# Patient Record
Sex: Female | Born: 1980 | Race: White | Hispanic: Yes | Smoking: Never smoker
Health system: Southern US, Community
[De-identification: ages and names within clinical notes are randomized; demographics above are authoritative.]

## PROBLEM LIST (undated history)

## (undated) DIAGNOSIS — K219 Gastro-esophageal reflux disease without esophagitis: Secondary | ICD-10-CM

## (undated) HISTORY — DX: Gastro-esophageal reflux disease without esophagitis: K21.9

## (undated) HISTORY — PX: TUBAL LIGATION: SHX77

---

## 2002-03-09 ENCOUNTER — Ambulatory Visit (HOSPITAL_COMMUNITY): Admission: AD | Admit: 2002-03-09 | Discharge: 2002-03-09 | Payer: Self-pay | Admitting: *Deleted

## 2002-03-09 ENCOUNTER — Encounter (INDEPENDENT_AMBULATORY_CARE_PROVIDER_SITE_OTHER): Payer: Self-pay | Admitting: Specialist

## 2002-04-13 ENCOUNTER — Encounter: Admission: RE | Admit: 2002-04-13 | Discharge: 2002-04-13 | Payer: Self-pay | Admitting: *Deleted

## 2003-03-21 ENCOUNTER — Inpatient Hospital Stay (HOSPITAL_COMMUNITY): Admission: AD | Admit: 2003-03-21 | Discharge: 2003-03-21 | Payer: Self-pay | Admitting: Obstetrics & Gynecology

## 2003-04-07 ENCOUNTER — Encounter: Admission: RE | Admit: 2003-04-07 | Discharge: 2003-04-07 | Payer: Self-pay | Admitting: *Deleted

## 2003-04-07 ENCOUNTER — Ambulatory Visit (HOSPITAL_COMMUNITY): Admission: RE | Admit: 2003-04-07 | Discharge: 2003-04-07 | Payer: Self-pay | Admitting: *Deleted

## 2003-04-21 ENCOUNTER — Encounter: Admission: RE | Admit: 2003-04-21 | Discharge: 2003-04-21 | Payer: Self-pay | Admitting: Family Medicine

## 2003-05-05 ENCOUNTER — Encounter: Admission: RE | Admit: 2003-05-05 | Discharge: 2003-05-05 | Payer: Self-pay | Admitting: *Deleted

## 2003-05-05 ENCOUNTER — Ambulatory Visit (HOSPITAL_COMMUNITY): Admission: RE | Admit: 2003-05-05 | Discharge: 2003-05-05 | Payer: Self-pay | Admitting: Family Medicine

## 2003-05-12 ENCOUNTER — Encounter: Admission: RE | Admit: 2003-05-12 | Discharge: 2003-05-12 | Payer: Self-pay | Admitting: *Deleted

## 2003-05-19 ENCOUNTER — Encounter: Admission: RE | Admit: 2003-05-19 | Discharge: 2003-05-19 | Payer: Self-pay | Admitting: *Deleted

## 2003-05-26 ENCOUNTER — Encounter: Payer: Self-pay | Admitting: Family Medicine

## 2003-05-26 ENCOUNTER — Encounter (INDEPENDENT_AMBULATORY_CARE_PROVIDER_SITE_OTHER): Payer: Self-pay | Admitting: *Deleted

## 2003-05-26 ENCOUNTER — Inpatient Hospital Stay (HOSPITAL_COMMUNITY): Admission: RE | Admit: 2003-05-26 | Discharge: 2003-05-29 | Payer: Self-pay | Admitting: Obstetrics and Gynecology

## 2003-05-26 ENCOUNTER — Encounter: Admission: RE | Admit: 2003-05-26 | Discharge: 2003-05-26 | Payer: Self-pay | Admitting: *Deleted

## 2003-05-30 ENCOUNTER — Encounter: Admission: RE | Admit: 2003-05-30 | Discharge: 2003-06-29 | Payer: Self-pay | Admitting: Obstetrics and Gynecology

## 2007-10-20 ENCOUNTER — Encounter (INDEPENDENT_AMBULATORY_CARE_PROVIDER_SITE_OTHER): Payer: Self-pay | Admitting: Obstetrics

## 2007-10-20 ENCOUNTER — Inpatient Hospital Stay (HOSPITAL_COMMUNITY): Admission: RE | Admit: 2007-10-20 | Discharge: 2007-10-23 | Payer: Self-pay | Admitting: Obstetrics

## 2008-12-10 LAB — CONVERTED CEMR LAB

## 2009-07-18 ENCOUNTER — Ambulatory Visit: Payer: Self-pay | Admitting: Family Medicine

## 2009-07-18 DIAGNOSIS — R21 Rash and other nonspecific skin eruption: Secondary | ICD-10-CM

## 2009-07-18 DIAGNOSIS — M722 Plantar fascial fibromatosis: Secondary | ICD-10-CM

## 2009-08-01 ENCOUNTER — Ambulatory Visit: Payer: Self-pay | Admitting: Family Medicine

## 2009-08-01 LAB — CONVERTED CEMR LAB
BUN: 10 mg/dL (ref 6–23)
CO2: 21 meq/L (ref 19–32)
Calcium: 9 mg/dL (ref 8.4–10.5)
Chloride: 106 meq/L (ref 96–112)
Cholesterol: 111 mg/dL (ref 0–200)
Creatinine, Ser: 0.54 mg/dL (ref 0.40–1.20)
Glucose, Bld: 71 mg/dL (ref 70–99)
HDL: 34 mg/dL — ABNORMAL LOW (ref >39)
LDL Cholesterol: 59 mg/dL (ref 0–99)
Potassium: 4 meq/L (ref 3.5–5.3)
Sodium: 140 meq/L (ref 135–145)
Total CHOL/HDL Ratio: 3.3
Triglycerides: 90 mg/dL (ref <150)
VLDL: 18 mg/dL (ref 0–40)
Vit D, 25-Hydroxy: 43 ng/mL (ref 30–89)

## 2009-08-02 ENCOUNTER — Encounter (INDEPENDENT_AMBULATORY_CARE_PROVIDER_SITE_OTHER): Payer: Self-pay | Admitting: Family Medicine

## 2009-08-31 ENCOUNTER — Ambulatory Visit: Payer: Self-pay | Admitting: Nurse Practitioner

## 2009-08-31 DIAGNOSIS — B353 Tinea pedis: Secondary | ICD-10-CM

## 2010-06-28 ENCOUNTER — Ambulatory Visit: Payer: Self-pay | Admitting: Nurse Practitioner

## 2010-09-11 NOTE — Assessment & Plan Note (Signed)
Summary: Acute - Tinea Pedis   Vital Signs:  Patient profile:   30 year old female Menstrual status:  regular LMP:     08/20/2009 Weight:      217.1 pounds Temp:     97.4 degrees F oral Pulse rate:   61 / minute Pulse rhythm:   regular Resp:     16 per minute BP sitting:   108 / 74  (left arm) Cuff size:   regular  Vitals Entered By: Levon Hedger (August 31, 2009 12:23 PM) CC: right foot infection or fungus x several months very itchy and is spreading Is Patient Diabetic? No Pain Assessment Patient in pain? yes     Location: foot Intensity: 5  Does patient need assistance? Functional Status Self care Ambulation Normal LMP (date): 08/20/2009     Enter LMP: 08/20/2009 Last PAP Result done at [facility]   Primary Care Provider:  Carolyne Fiscal  CC:  right foot infection or fungus x several months very itchy and is spreading.  History of Present Illness:  Pt into the office with foot problems. Right foot is very painful and puritic. Started 1 month ago. Previously affected about 9 months ago and she was treated with the ointment. 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 Also sweats alot and feet stay moist   Medications Prior to Update: 1)  None  Allergies (verified): No Known Drug Allergies  Review of Systems CV:  Denies chest pain or discomfort. Resp:  Denies cough. Derm:  Complains of lesion(s).  Physical Exam  General:  alert.   Neurologic:  alert & oriented X3 and gait normal.   Skin:  right foot - 3rd, 4th and 5th toe with peeling skin, excoriation - tinea pedis Psych:  Oriented X3.     Impression & Recommendations:  Problem # 1:  TINEA PEDIS (ICD-110.4)  advised pt of the dx and treatment  Her updated medication list for this problem includes:    Fluconazole 150 Mg Tabs (Fluconazole) ..... One tablet by mouth x 1 dose    Lotrisone 1-0.05 % Crea  (Clotrimazole-betamethasone) ..... One application topically two times a day to affected area  Complete Medication List: 1)  Fluconazole 150 Mg Tabs (Fluconazole) .... One tablet by mouth x 1 dose 2)  Lotrisone 1-0.05 % Crea (Clotrimazole-betamethasone) .... One application topically two times a day to affected area  Patient Instructions: 1)  Take difllucan 150mg  by mouth x 1 dose 2)  Use ointment two times a day to affected area.  Be sure to keep feet clean and dry 3)  follow up as needed Prescriptions: FLUCONAZOLE 150 MG TABS (FLUCONAZOLE) One tablet by mouth x 1 dose  #1 x 0   Entered and Authorized by:   Lehman Prom FNP   Signed by:   Lehman Prom FNP on 08/31/2009   Method used:   Faxed to ...       [facility] (retail)       [address] [place], [place]  [address]       Ph: [phone]       Fax: [phone]   RxID:   [id number] LOTRISONE 1-0.05 % CREA (CLOTRIMAZOLE-BETAMETHASONE) One application topically two times a day to affected area  #60gm x 1   Entered and Authorized by:   Lehman Prom FNP   Signed by:  Lehman Prom FNP on 08/31/2009   Method used:   Faxed to ...       [facility] (retail)       [address] [place], [place]  [address]       Ph: [phone]       Fax: [phone]   RxID:   [id number] FLUCONAZOLE 150 MG TABS (FLUCONAZOLE) One tablet by mouth x 1 dose  #1 x 0   Entered and Authorized by:   Lehman Prom FNP   Signed by:   Lehman Prom FNP on 08/31/2009   Method used:   Print then Give to Patient   RxID:   [id number]

## 2010-09-11 NOTE — Letter (Signed)
Summary: Handout Printed  Printed Handout:  - Tinea Pedis (Athlete's Foot)-Brief 

## 2010-12-25 NOTE — Op Note (Signed)
Sara [name], Sara [name]         ACCOUNT NO.:  [id number]   MEDICAL RECORD NO.:  [id number]          PATIENT TYPE:  INP   LOCATION:  9199                          [facility]   PHYSICIAN:  Kathreen Cosier, M.D.DATE OF BIRTH:  06-16-1981   DATE OF PROCEDURE:  10/20/2007  DATE OF DISCHARGE:                               OPERATIVE REPORT   PREOPERATIVE DIAGNOSIS:  Previous cesarean section, at term,  multiparity, desires repeat and tubal ligation.   POSTOPERATIVE DIAGNOSIS:  Previous cesarean section, at term,  multiparity, desires repeat and tubal ligation.   SURGEON:  Kathreen Cosier, M.D.   FIRST ASSISTANT:  Charles A. Clearance Coots, M.D.   ANESTHESIA:  Spinal.   PROCEDURE:  The patient placed on the operating table in supine position  after spinal administered.  Abdomen prepped and draped, bladder emptied  with Foley catheter.  Transverse suprapubic incision made through the  old scar, carried down to rectus fascia.  Fascia cleaned and incised the  length of incision.  Recti muscles retracted laterally.  Peritoneum  incised longitudinally.  Transverse incision made in the visceral  peritoneum above the bladder.  Bladder mobilized inferiorly.  Transverse  lower uterine incision made, fluid clear.  The patient delivered from  the LOA position of a female Apgar 8/9, weighing 7 pounds 1 ounce.  The  team was in attendance.  Placenta anterior fundal removed manually and  sent to pathology.  Uterine cavity cleaned with dry laps.  The uterine  incision closed in one layer with continuous suture of #1 chromic.  Hemostasis was satisfactory.  The right tube was grasped in midportion  with a Babcock clamp.  0 plain suture placed in the mesosalpinx below  the portion of tube within clamp.  This was cut, tied approximately 1  inch of tube transected.  Hemostasis satisfactory.  Procedure done in a  similar fashion the other side.  Lap and sponge counts correct.  Abdomen  closed in  layers. Peritoneum continuous suture of 0 chromic, fascia  continuous suture of 0 Dexon and subcutaneous tissue interrupted 2-0  plain, skin closed with subcuticular stitch of 4-0 Monocryl.  Blood loss  600 mL.  The patient tolerated the procedure well, taken to recovery  room in good condition.           ______________________________  Kathreen Cosier, M.D.     BAM/MEDQ  D:  10/20/2007  T:  10/21/2007  Job:  [id number]

## 2010-12-28 NOTE — Discharge Summary (Signed)
   NAMEBEXLEE, [name]                     ACCOUNT NO.:  [id number]   MEDICAL RECORD NO.:  [id number]                   PATIENT TYPE:  INP   LOCATION:  9308                                 [facility]   PHYSICIAN:  Phil D. Okey Dupre, M.D.                  DATE OF BIRTH:  26-Jun-1981   DATE OF ADMISSION:  05/26/2003  DATE OF DISCHARGE:  05/29/2003                                 DISCHARGE SUMMARY   ADMISSION DIAGNOSES:  13. 30 year old gravida 4, para 2, 0, 1, 2, at 37-3/7 weeks with decreased     fetal movement.  2. Noted intrauterine fetal demise of twin A on ultrasound.  3. Twin B noted to be breech on ultrasound.   DISCHARGE DIAGNOSES:  37. 30 year old gravida 4, para 3, 0, 1, 3, postoperative day number three     status post repeat lower transverse cesarean section.  2. Intrauterine fetal demise of twin A.  3. Viable twin B with Apgar's 7 at one minute, 9 at five minutes.   DISCHARGE MEDICATIONS:  1. Ibuprofen 600 mg one p.o. q.6h p.r.n.  2. Percocet 5/325 q.4-6h p.r.n.  3. Prenatal vitamin one p.o. daily.  4. Micronor one p.o. daily for contraception.   ADMISSION HISTORY:  Ms. [name] was admitted at 37-3/7 weeks with a  noted IUFD of twin A with breech presentation of twin B.  She was taken for  repeat lower transverse C-section.  She does have a history of a lower  transverse C-section for breech presentation of her first pregnancy.   HOSPITAL COURSE:  The patient had a routine postoperative course.  She was  visited by pastoral care and social work for her fetal death.  Her other  infant is doing well in the NICU.  On postoperative day number one her  hemoglobin was noted to be 10.6.  She remained asymptomatic, tolerating p.o.  well.   CONDITION ON DISCHARGE:  The patient was discharged home in stable  condition.   INSTRUCTIONS:  The patient was told of her medical regimen.  Her staples  were removed prior to discharge.  She is to follow up at [facility]  in  six weeks.  Arrangements were made for burial of baby A.      Maurice March, M.D.                        Phil D. Okey Dupre, M.D.    LC/MEDQ  D:  05/29/2003  T:  05/30/2003  Job:  [id number]

## 2010-12-28 NOTE — Op Note (Signed)
   [name], Sara [name]                     ACCOUNT NO.:  [id number]   MEDICAL RECORD NO.:  [id number]                   PATIENT TYPE:  AMB   LOCATION:  SDC                                  [facility]   PHYSICIAN:  Clement Husbands, M.D.         DATE OF BIRTH:  [birth year]   DATE OF PROCEDURE:  DATE OF DISCHARGE:                                 OPERATIVE REPORT   PREOPERATIVE DIAGNOSIS:  Incomplete abortion.   POSTOPERATIVE DIAGNOSIS:  Incomplete abortion.   OPERATION PERFORMED:  Dilation and curettage.   SURGEON:  Burnadette Peter, M.D.   ANESTHESIA:  General.   DESCRIPTION OF PROCEDURE:  With the patient under satisfactory general  anesthesia in the dorsal lithotomy position, a pelvic examination was  performed.  The uterus was the upper limits of normal size.  The anterior  lip of the cervix was grasped with a double-toothed tenaculum.  The external  os was dilated.  It was already self-dilated.  The internal os admitted a  fingertip.  Sharp curettage yielded a moderate amount of placental tissue  and small blood clot.  Exploration with polyp forceps removed additional  tissue from the left side of the patient's uterus which was more adherent.  Sharp curettage was then done again and the endometrial cavity was felt to  be smooth.  Blood loss was about 10 cc.  Sponge and needle count was  correct.  The patient tolerated the procedure well and returned to the  recovery room in satisfactory condition.                                               Clement Husbands, M.D.    EFR/MEDQ  D:  03/09/2002  T:  03/11/2002  Job:  [id number]   cc:   Edwena Felty. Ashley Royalty, M.D.

## 2010-12-28 NOTE — Op Note (Signed)
Sara [name], Sara [name]                     ACCOUNT NO.:  [id number]   MEDICAL RECORD NO.:  [id number]                   PATIENT TYPE:  INP   LOCATION:  9308                                 [facility]   PHYSICIAN:  Lesly Dukes, M.D.              DATE OF BIRTH:  1980-12-02   DATE OF PROCEDURE:  05/26/2003  DATE OF DISCHARGE:                                 OPERATIVE REPORT   PREOPERATIVE DIAGNOSES:  Para 2-0-1-2 at 37 weeks 3 days' estimated  gestational age with twin intrauterine pregnancy; baby A is  intrauterine fetal demise, vertex and baby B is breech, viable.   POSTOPERATIVE DIAGNOSES:  Para 2-0-1-2 at 37 weeks 3 days' estimated  gestational age with twin intrauterine pregnancy; baby A is  intrauterine fetal demise, vertex and baby B is breech, viable.   PROCEDURE:  Repeat low flap transverse cesarean section.   SURGEON:  Lesly Dukes, M.D.   ASSISTANTMichele Mcalpine D. Okey Dupre, M.D.   ESTIMATED BLOOD LOSS:  800.   ANESTHESIA:  Spinal.   COMPLICATIONS:  None.   PATHOLOGY:  Placenta.   FINDINGS:  Nonviable baby A; female; vertex; green fluid; macerated skin.  Viable baby boy B; female; footling breech; pH 7.30; clear fluid.  Placenta  with three-vessel cord x2; questionable infarct on baby A placenta.  Normal  tubes and ovaries bilaterally, normal uterus.  Pediatrics at delivery.   DESCRIPTION OF PROCEDURE:  After informed consent was obtained, the patient  was taken to the operating room with IV running, spinal anesthesia was found  to be adequate, and the patient was placed in the dorsal supine position  with a leftward tilt and prepared and draped in the normal sterile fashion.  A Foley was placed in the bladder.  A Pfannenstiel skin incision was made  with a scalpel and carried down to the underlying layer of fascia with the  Bovie.  The Bovie was incised in the midline with a scalpel and extended  bilaterally with the Mayo scissors.  The superior and inferior  aspect of the  fascial incision were grasped with Kocher clamps, tented up, and dissected  off sharply and bluntly from underlying layers of rectus muscle.  The rectus  muscles were separated in the midline.  The peritoneum was identified,  tented up, and entered sharply with the Metzenbaum scissors.  The incision  was extended both superiorly and inferiorly with good visualization of the  bladder.  The bladder blade was inserted.  The vesicouterine peritoneum was  identified, tented up, and entered sharply with the Metzenbaum scissors.  This incision was extended bilaterally.  The bladder flap was created  digitally.  The bladder blade was inserted again.  The uterine incision was  made in a transverse fashion in the lower uterine segment.  This incision  was extended bilaterally with the bandage scissors.  Baby A delivered with  the above findings.  The cord was clamped  and cut and handed off to the  pediatrician.  Baby B delivered as a footling breech with the above  findings.  The nose and mouth were suctioned and the cord was clamped and  cut, and the baby was handed off to the awaiting pediatrician.  Cord blood  was sent for type and screen.  Cord gases were also sent.  The placenta  delivered spontaneously.  See above description of placenta.  The uterus was  cleared of all clots and debris.  The uterine incision was closed with 0  Vicryl in a running locked fashion.  Excellent hemostasis was noted.  The  gutters were cleared of all clots and debris.  The uterine incision was  noted to be hemostatic one last time.  The fascia was then closed with 0  Vicryl in a running fashion.  Good hemostasis was noted.  The subcutaneous  tissue was copiously irrigated and the skin was closed with staples.  A  dressing was placed, and the sponge, lap, instrument, and needle count were  correct x2, and the patient went to the recovery room in stable condition.                                                Lesly Dukes, M.D.    Lora Paula  D:  05/26/2003  T:  05/27/2003  Job:  [id number]

## 2010-12-28 NOTE — Discharge Summary (Signed)
Sara [name], Sara [name]         ACCOUNT NO.:  [id number]   MEDICAL RECORD NO.:  [id number]          PATIENT TYPE:  INP   LOCATION:  9119                          [facility]   PHYSICIAN:  Kathreen Cosier, M.D.DATE OF BIRTH:  1981/02/01   DATE OF ADMISSION:  10/20/2007  DATE OF DISCHARGE:  10/23/2007                               DISCHARGE SUMMARY   The patient is a 30 year old gravida 5, para 3-0-1-3, [facility] October 25, 2007.  She had two previous C-sections and was now at term and desired  repeat C-section and tubal ligation.  The patient underwent a repeat low-  transverse cesarean section and tubal ligation.  She had a female.  Apgars 8 and 9, weighing 7 pounds 1 ounce, also portion of tubes was  sent to pathology.  She did well.  On admission, her hemoglobin was  12.5, white count 7.9.  Postop 8.2, 7.2, and platelets 150-126.  HIV was  negative.  RPR was negative.  She was discharged home on the third  postoperative day on Tylox for pain and to see me in 6 weeks.   DISCHARGE DIAGNOSIS:  Status post repeat low-transverse cesarean section  and tubal ligation at term.           ______________________________  Kathreen Cosier, M.D.     BAM/MEDQ  D:  11/11/2007  T:  11/11/2007  Job:  [id number]

## 2011-03-29 ENCOUNTER — Other Ambulatory Visit: Payer: Self-pay

## 2011-05-06 LAB — RPR: RPR Ser Ql: NONREACTIVE

## 2011-05-06 LAB — CBC
HCT: 23.7 — ABNORMAL LOW
Hemoglobin: 12.5
Hemoglobin: 8.2 — ABNORMAL LOW
MCV: 90.9
Platelets: 126 — ABNORMAL LOW
RBC: 3.93
RDW: 13.5

## 2011-05-06 LAB — RAPID HIV SCREEN (WH-MAU): Rapid HIV Screen: NONREACTIVE

## 2012-06-09 ENCOUNTER — Encounter (HOSPITAL_COMMUNITY): Payer: Self-pay | Admitting: *Deleted

## 2012-06-09 ENCOUNTER — Emergency Department (INDEPENDENT_AMBULATORY_CARE_PROVIDER_SITE_OTHER)
Admission: EM | Admit: 2012-06-09 | Discharge: 2012-06-09 | Disposition: A | Discharge status: Home / Self Care | Payer: Self-pay | Source: Home / Self Care

## 2012-06-09 DIAGNOSIS — R300 Dysuria: Secondary | ICD-10-CM

## 2012-06-09 DIAGNOSIS — N390 Urinary tract infection, site not specified: Secondary | ICD-10-CM

## 2012-06-09 DIAGNOSIS — R350 Frequency of micturition: Secondary | ICD-10-CM

## 2012-06-09 LAB — POCT URINALYSIS DIP (DEVICE)
Protein, ur: 30 mg/dL — AB
Specific Gravity, Urine: 1.025 (ref 1.005–1.030)
Urobilinogen, UA: 0.2 mg/dL (ref 0.0–1.0)
pH: 5.5 (ref 5.0–8.0)

## 2012-06-09 MED ORDER — CEPHALEXIN 500 MG PO CAPS: 500.0000 mg | ORAL_CAPSULE | Freq: Two times a day (BID) | ORAL | Status: DC

## 2012-06-09 NOTE — ED Notes (Signed)
Pt reports burning with urination for the past 3 weeks.no previous hx  - denies other symptoms

## 2012-06-09 NOTE — ED Provider Notes (Signed)
Medical screening examination/treatment/procedure(s) were performed by non-physician practitioner and as supervising physician I was immediately available for consultation/collaboration.  Clarity Ciszek, M.D.   Regis Wiland C Dorrien Grunder, MD 06/09/12 2102 

## 2012-06-09 NOTE — ED Provider Notes (Signed)
History     CSN: [id number]  Arrival date & time 06/09/12  1500   None     Chief Complaint  Patient presents with  . Urinary Tract Infection    (Consider location/radiation/quality/duration/timing/severity/associated sxs/prior treatment) Patient is a 31 y.o. female presenting with urinary tract infection. The history is provided by the patient.  Urinary Tract Infection This is a new problem. The current episode started more than 1 week ago (3 weeks). The problem occurs daily. The problem has not changed since onset.Associated symptoms include abdominal pain. Nothing aggravates the symptoms. Nothing relieves the symptoms. She has tried nothing for the symptoms.  Sara [name] is a 31 y.o. female who complains of urinary frequency, urgency and dysuria x 3 weeks, without flank pain, fever, chills, or abnormal vaginal discharge or bleeding.   Has not taken medication for symptoms.  No history of UTI.  Denies known kidney disease or structural abnormalities. History reviewed. No pertinent past medical history.  History reviewed. No pertinent past surgical history.  Family History  Problem Relation Age of Onset  . Family history unknown: Yes    History  Substance Use Topics  . Smoking status: Not on file  . Smokeless tobacco: Not on file  . Alcohol Use: Yes     socially    OB History    Grav Para Term Preterm Abortions TAB SAB Ect Mult Living                  Review of Systems  Constitutional: Negative.   Respiratory: Negative.   Cardiovascular: Negative.   Gastrointestinal: Positive for abdominal pain. Negative for nausea and vomiting.  Genitourinary: Positive for dysuria, urgency, frequency and pelvic pain. Negative for flank pain, vaginal bleeding, vaginal discharge and difficulty urinating.    Allergies  Review of patient's allergies indicates no known allergies.  Home Medications   Current Outpatient Rx  Name Route Sig Dispense Refill  . CEPHALEXIN 500  MG PO CAPS Oral Take 1 capsule (500 mg total) by mouth 2 (two) times daily. 14 capsule 0    BP 125/80  Pulse 72  Temp 98.2 F (36.8 C) (Oral)  Resp 18  SpO2 100%  Physical Exam  Nursing note and vitals reviewed. Constitutional: She is oriented to person, place, and time. Vital signs are normal. She appears well-developed and well-nourished. She is active and cooperative.  HENT:  Head: Normocephalic.  Eyes: Conjunctivae normal and EOM are normal. Pupils are equal, round, and reactive to light. No scleral icterus.  Neck: Trachea normal and normal range of motion. Neck supple.  Cardiovascular: Normal rate, regular rhythm, normal heart sounds and intact distal pulses.   No murmur heard. Pulmonary/Chest: Effort normal and breath sounds normal.  Abdominal: Soft. Bowel sounds are normal. There is tenderness in the suprapubic area.  Lymphadenopathy:    She has no cervical adenopathy.       Right: No inguinal adenopathy present.       Left: No inguinal adenopathy present.  Neurological: She is alert and oriented to person, place, and time. No cranial nerve deficit or sensory deficit.  Skin: Skin is warm and dry.  Psychiatric: She has a normal mood and affect. Her speech is normal and behavior is normal. Judgment and thought content normal. Cognition and memory are normal.    ED Course  Procedures (including critical care time)  Labs Reviewed  POCT URINALYSIS DIP (DEVICE) - Abnormal; Notable for the following:    Ketones, ur TRACE (*)  Hgb urine dipstick MODERATE (*)     Protein, ur 30 (*)     Leukocytes, UA LARGE (*)  Biochemical Testing Only. Please order routine urinalysis from main lab if confirmatory testing is needed.   All other components within normal limits  POCT PREGNANCY, URINE   No results found.   1. UTI (lower urinary tract infection)   2. Dysuria   3. Urinary frequency       MDM  Increase fluids, take medications as prescribed.          Johnsie Kindred, NP 06/09/12 [phone]

## 2013-01-25 ENCOUNTER — Other Ambulatory Visit (HOSPITAL_COMMUNITY)
Admission: RE | Admit: 2013-01-25 | Discharge: 2013-01-25 | Disposition: A | Discharge status: Home / Self Care | Payer: No Typology Code available for payment source | Source: Ambulatory Visit | Attending: Family Medicine | Admitting: Family Medicine

## 2013-01-25 ENCOUNTER — Ambulatory Visit: Payer: No Typology Code available for payment source | Attending: Family Medicine | Admitting: Family Medicine

## 2013-01-25 VITALS — BP 112/73 | HR 70 | Temp 98.7°F | Resp 16 | Ht 64.0 in | Wt 212.0 lb

## 2013-01-25 DIAGNOSIS — Z1151 Encounter for screening for human papillomavirus (HPV): Secondary | ICD-10-CM | POA: Insufficient documentation

## 2013-01-25 DIAGNOSIS — Z124 Encounter for screening for malignant neoplasm of cervix: Secondary | ICD-10-CM

## 2013-01-25 DIAGNOSIS — Z23 Encounter for immunization: Secondary | ICD-10-CM

## 2013-01-25 DIAGNOSIS — Z0000 Encounter for general adult medical examination without abnormal findings: Secondary | ICD-10-CM

## 2013-01-25 DIAGNOSIS — Z01419 Encounter for gynecological examination (general) (routine) without abnormal findings: Secondary | ICD-10-CM | POA: Insufficient documentation

## 2013-01-25 DIAGNOSIS — K649 Unspecified hemorrhoids: Secondary | ICD-10-CM | POA: Insufficient documentation

## 2013-01-25 LAB — CBC WITH DIFFERENTIAL/PLATELET
Basophils Absolute: 0 10*3/uL (ref 0.0–0.1)
HCT: 38.3 % (ref 36.0–46.0)
Hemoglobin: 13.2 g/dL (ref 12.0–15.0)
Lymphocytes Relative: 22 % (ref 12–46)
Lymphs Abs: 2.2 10*3/uL (ref 0.7–4.0)
MCV: 85.7 fL (ref 78.0–100.0)
Monocytes Absolute: 0.5 10*3/uL (ref 0.1–1.0)
Monocytes Relative: 5 % (ref 3–12)
Neutro Abs: 7.2 10*3/uL (ref 1.7–7.7)
WBC: 10 10*3/uL (ref 4.0–10.5)

## 2013-01-25 LAB — COMPREHENSIVE METABOLIC PANEL
BUN: 10 mg/dL (ref 6–23)
CO2: 23 mEq/L (ref 19–32)
Calcium: 9.6 mg/dL (ref 8.4–10.5)
Chloride: 105 mEq/L (ref 96–112)
Creat: 0.68 mg/dL (ref 0.50–1.10)
Total Bilirubin: 0.5 mg/dL (ref 0.3–1.2)

## 2013-01-25 LAB — LDL CHOLESTEROL, DIRECT: Direct LDL: 102 mg/dL — ABNORMAL HIGH

## 2013-01-25 LAB — TSH: TSH: 1.002 u[IU]/mL (ref 0.350–4.500)

## 2013-01-25 NOTE — Patient Instructions (Signed)
*Use Preparation H or its generic for hemorrhoids when they become uncomfortable  Hemorroides  (Hemorrhoids) Las hemorroides son venas inflamadas alrededor del recto o del ano. Hay dos tipos de hemorroides:   Hemorroides internas. Aparecen en las venas del interior del recto. Pueden abultarse hacia el exterior e irritarse y Cabin crew.  Hemorroides externas. Se producen en las venas externas al ano y pueden sentirse como un bulto o zona hinchada dura y dolorosa cerca del ano. CAUSAS   Embarazo.   Obesidad.   Constipacin o diarrea.   Dificultad para mover el intestino.   Permanecer sentado durante largos perodos en el inodoro.  Levantar pesas u otras actividades que impliquen esfuerzo.  Sexo anal. SNTOMAS   Dolor.   Picazn o irritacin anal.   Sangrado rectal.   Prdida fecal.   Hinchazn anal.   Uno o ms bultos en la zona anal.  DIAGNSTICO  El mdico puede diagnosticar las hemorroides mediante un examen visual. Otros estudios o anlisis que se pueden realizar son:   Examen de la zona rectal con [place] Dear mano enguantada (examen digital rectal).   Examen de canal anal usando un pequeo tubo (endoscopio).   Anlisis de sangre si ha perdido [place] cantidad significativa de [place].  Estudio para observar el interior del colon (sigmoidoscopa o colonoscopa). TRATAMIENTO  La mayora de las hemorroides pueden tratarse en casa. Sin embargo, si los sntomas no mejoran o tiene Runner, broadcasting/film/video, el mdico puede Education officer, environmental un procedimiento para disminuir las hemorroides o extirparlas completamente. Los tratamientos posibles son:   Colocacin de una banda de goma en la base de la hemorroide para cortar la circulacin (ligadura con Curator).   Inyeccin de una sustancia qumica para reducir la hemorroide (escleroterapia).   Utilizacin de un instrumento para quemar la hemorroide (terapia con luz infrarroja).   Extirpacin quirrgica de las hemorroides  (hemorroidectoma).   Colocacin de grapas en la hemorroide para bloquear el flujo de sangre a los tejidos (engrapado de hemorroides).  INSTRUCCIONES PARA EL CUIDADO EN EL HOGAR   Consuma alimentos con fibra, como cereales integrales, legumbres, frutos secos, frutas y verduras. Pregntele a su mdico acerca de tomar productos con fibra aadida en ellos (suplementos defibra).  Aumente la ingesta de lquidos. Beba gran cantidad de lquido para mantener la orina de tono claro o color amarillo plido.   Haga ejercicios regularmente.   Vaya al bao cuando sienta la necesidad de mover el intestino. No espere.   Evite hacer fuerza al mover el intestino.   Mantenga la zona anal limpia y seca. Use papel higinico hmedo o toallitas humedecidas despus de mover el intestino.   Puede usar o Contractor segn las indicaciones algunas cremas especiales y supositorios.   Tome slo medicamentos de venta libre o recetados, segn las indicaciones del mdico.   Tome baos de asiento tibios durante 15 a 20 minutos, 3 a 4 veces por da para Primary school teacher y las [place].   Coloque una bolsa de hielo sobre las hemorroides si le duelen o se hinchan. Usar las compresas de [employer] baos de asiento puede ser [place].   Ponga el hielo en una bolsa plstica.   Colquese una toalla entre la piel y la bolsa de hielo.   Deje el hielo durante 15 a 20 minutos y aplquelo 3 a 4 veces por da.   No utilice una almohada en forma de aro ni se siente en el inodoro durante perodos prolongados. Esto aumenta la afluencia de sangre y Chief Technology Officer.  SOLICITE ATENCIN MDICA SI:   Aumenta el dolor y la hinchazn y no puede controlarlo con la medicacin o con Pharmacist, community.  Tiene un sangrado que no puede parar.  No puede mover el intestino.  Siente dolor o tiene inflamacin fuera de la zona de las hemorroides. ASEGRESE DE QUE:   Comprende estas instrucciones.  Controlar su  enfermedad.  Solicitar ayuda de inmediato si no mejora o si empeora. Document Released: 07/29/2005 Document Revised: 07/15/2012 [facility] Patient Information 2014 [place], [place].

## 2013-01-25 NOTE — Progress Notes (Signed)
Subjective:     Patient ID: Sara [name], female   DOB: 03-12-1981, 32 y.o.   MRN: [id number]  HPI 32 year old female here to establish care and for a wellness visit. She is spanish speaking and translation provided via language line.   Health maint # Health maint - pap - most recently 2 years ago, no abnormals in past - mammo/pert FH - no mammos in past, mgm had breast cancer (she was very old) - physician breast exam - unsure when last had - PSA - n/a - tdap - unsure when had - flu - did not have last year - pneumovax - has not had (not indicated) - zostavax (if over 60) - n/a - c-scope/pert FH - has not had c-scope, no FH colon cancer - dexa - no fractrures or history of steroids use - BP screen - 112/73 today - cholesterol screen - not sure when was last checked - chlamydia screen (if under age 84) - never had any STDs, never checked, over age 34 - tobacco - nonsmoker - alcohol - nonuser - drugs - nonuser - vit D - not sure if checked ever.  - Hep C (If born between 63-65) - n/a  Also concerned about possible warts around anus. They get puffy and uncomfrtable periodically. She has had them for many years.     Review of Systems per hpi     Objective:   Physical Exam  Nursing note and vitals reviewed. Constitutional: She appears well-developed and well-nourished.  Cardiovascular: Normal rate and regular rhythm.   Pulmonary/Chest: Effort normal and breath sounds normal.  Abdominal: Soft. Bowel sounds are normal. She exhibits no distension. There is no tenderness. There is no rebound and no guarding.  Genitourinary: Vagina normal and uterus normal. No vaginal discharge found.  Hemorrhoids around anus, nonswollen at present. No evidence of warts  Skin: Skin is warm and dry.       Assessment:     Health maintenance examination - Plan: Tdap vaccine greater than or equal to 7yo IM, Comprehensive metabolic panel, HgB A1c, CBC with Differential, LDL Cholesterol,  Direct, TSH, Vitamin D, 25-hydroxy  Hemorrhoids  Cervical cancer screening - Plan: Cytology - PAP       Plan:     Health maint # Health maint - pap - done today with HPV, if wnl no need to repeat until 2019 - mammo/pert FH - will need at age 54 - physician breast exam - will need at age 73 - PSA - n/a - tdap - ordered today, will need tetanus only in 10 years - flu - will need this fall - pneumovax -  At age 54 - zostavax (if over 67) - at age 84 - c-scope/pert FH - at age 68 - dexa - at age 15 - BP screen - wnl today, repeat in 1 year - cholesterol screen - checking today - chlamydia screen (if under age 34) - no indication for testing today - tobacco - nonsmoker - alcohol - nonuser - drugs - nonuser - vit D - checking today - Hep C (If born between 68-65) - n/a  - Hemorrhoids - info given on OTC products to treat  rtc 2 weeks to review labs and discuss any chronic problems she is concerned about. Be seen earlier if needed.

## 2013-01-25 NOTE — Progress Notes (Signed)
Patient here for a physical and pap smear Here with her daughter to interpret

## 2013-01-26 LAB — VITAMIN D 25 HYDROXY (VIT D DEFICIENCY, FRACTURES): Vit D, 25-Hydroxy: 39 ng/mL (ref 30–89)

## 2013-01-27 ENCOUNTER — Encounter: Payer: Self-pay | Admitting: Family Medicine

## 2013-01-28 ENCOUNTER — Telehealth: Payer: Self-pay

## 2013-01-28 NOTE — Telephone Encounter (Signed)
Left message to return our call.

## 2013-02-05 ENCOUNTER — Ambulatory Visit: Payer: No Typology Code available for payment source | Attending: Family Medicine | Admitting: Internal Medicine

## 2013-02-05 ENCOUNTER — Encounter: Payer: Self-pay | Admitting: Internal Medicine

## 2013-02-05 VITALS — BP 108/75 | HR 70 | Temp 98.7°F | Resp 16 | Ht 67.0 in | Wt 214.0 lb

## 2013-02-05 DIAGNOSIS — K219 Gastro-esophageal reflux disease without esophagitis: Secondary | ICD-10-CM

## 2013-02-05 DIAGNOSIS — E7800 Pure hypercholesterolemia, unspecified: Secondary | ICD-10-CM

## 2013-02-05 MED ORDER — FAMOTIDINE 20 MG PO TABS: 20.0000 mg | ORAL_TABLET | Freq: Two times a day (BID) | ORAL | Status: DC | PRN

## 2013-02-05 NOTE — Progress Notes (Signed)
Patient ID: Sara [name], female   DOB: 03/21/1981, 32 y.o.   MRN: [id number]   CC:  Burning in throat.  HPI:  [name] is a 32 year old woman with a PMH of plantar fasciitis who is an established patient here. Pap smear, and routine health maintenance laboratories including a CMET, CBC, LDL, TSH, and vitamin D were 01/25/2013. These results were reviewed with the patient. Her LDL was slightly high 102 and she was counseled regarding a low fat diet. The patient's only complaint today is a burning sensation in the back of her throat, precipitated by ingestion of chocolates. No specific alleviating factors.  No Known Allergies No past medical history on file. Current Outpatient Prescriptions on File Prior to Visit  Medication Sig Dispense Refill  . cephALEXin (KEFLEX) 500 MG capsule Take 1 capsule (500 mg total) by mouth 2 (two) times daily.  14 capsule  0   No current facility-administered medications on file prior to visit.   No family history on file. History   Social History  . Marital Status: Legally Separated    Spouse Name: N/A    Number of Children: N/A  . Years of Education: N/A   Occupational History  . Not on file.   Social History Main Topics  . Smoking status: Never Smoker   . Smokeless tobacco: Not on file  . Alcohol Use: Yes     Comment: socially  . Drug Use: No  . Sexually Active: Yes   Other Topics Concern  . Not on file   Social History Narrative  . No narrative on file    Review of Systems: Constitutional: No fever or chills;  Appetite normal; No weight loss.  HEENT: No blurry vision or diplopia, no pharyngitis or dysphagia CV: No chest pain or arrhythmia.  Resp: No SOB, no cough. GI: No N/V, no diarrhea, no melena or hematochezia.  GU: No dysuria or hematuria.  MSK: no myalgias/arthralgias.  Neuro:  No headache or focal neurological deficits.  Psych: No depression or anxiety.  Endo: No thyroid disease or DM.  Skin: No rashes or lesions.  Heme:  No anemia or blood dyscrasia   Objective:   Filed Vitals:   02/05/13 1549  BP: 108/75  Pulse: 70  Temp: 98.7 F (37.1 C)  Resp: 16    Physical Exam  Constitutional: Appears well-developed and well-nourished. No distress.  HENT: Normocephalic. External right and left ear normal. Oropharynx is clear and moist.  Eyes: Conjunctivae and EOM are normal. PERRLA, no scleral icterus.  Neck: Normal ROM. Neck supple. No JVD. No tracheal deviation. No thyromegaly.  CVS: RRR, S1/S2 +, no murmurs, no gallops, no carotid bruit.  Pulmonary: Effort and breath sounds normal, no stridor, rhonchi, wheezes, rales.  Abdominal: Soft. BS +,  no distension, tenderness, rebound or guarding. Skin: Skin is warm and dry. No rash noted. Not diaphoretic. No erythema. No pallor.  Psychiatric: Normal mood and affect. Behavior, judgment, thought content normal.   Lab Results  Component Value Date   WBC 10.0 01/25/2013   HGB 13.2 01/25/2013   HCT 38.3 01/25/2013   MCV 85.7 01/25/2013   PLT 272 01/25/2013   Lab Results  Component Value Date   CREATININE 0.68 01/25/2013   BUN 10 01/25/2013   NA 141 01/25/2013   K 3.8 01/25/2013   CL 105 01/25/2013   CO2 23 01/25/2013    No results found for this basename: HGBA1C   Lipid Panel     Component Value  Date/Time   CHOL 111 08/01/2009 2111   TRIG 90 08/01/2009 2111   HDL 34* 08/01/2009 2111   CHOLHDL 3.3 Ratio 08/01/2009 2111   VLDL 18 08/01/2009 2111   LDLCALC 59 08/01/2009 2111       Assessment and plan:  1. GERD: Pepcid 20 mg twice a day when necessary prescribed. 2. Elevated LDL cholesterol level: Patient instructed on a low-fat diet and given written handouts regarding low fat diet.   Routine Health Maintenance   Lipid Screening Q 5 years: 01/25/13.  LDL 102.  PAP annually 21-30, Q 3 years > 30: 01/25/13.  WNL.  HPV negative.  HTN Annually:  BP 108/75   Return to the clinic: 1 year.  Signed:  Dr. Trula Ore Laiyla Slagel 02/05/2013 4:13 PM

## 2013-02-05 NOTE — Patient Instructions (Signed)
Dieta para el control del colesterol y las grasas  (Fat and Cholesterol Control Diet) Los niveles de colesterol en el organismo estn determinados significativamente por su dieta. Los niveles de colesterol tambin se relacionan con la enfermedad cardaca. El material que sigue ayuda a Software engineer relacin y a Chiropractor qu puede hacer para mantener su corazn sano. No todo el colesterol es [place]. Las lipoprotenas de baja densidad (LDL) forman el colesterol "malo". El colesterol malo puede ocasionar depsitos de grasa que se acumulan en el interior de las arterias. Las lipoprotenas de alta densidad (HDL) es el colesterol "bueno". Ayuda a remover el colesterol LDL "malo" de la [place]. El colesterol es un factor de riesgo muy importante para la enfermedad cardaca. Otros factores de riesgo son la hipertensin arterial, el hbito de fumar, el estrs, la herencia y [place].  El msculo cardaco obtiene el suministro de sangre a travs de las arterias coronarias. Si su colesterol LDL ("malo") est elevado y el HDL ("bueno") es bajo, tiene un factor de riesgo para que se formen depsitos de Holiday representative en las arterias coronarias (los vasos sanguneos que suministran sangre al corazn). Esto hace que haya menos lugar para que la sangre circule. Sin la suficiente sangre y oxgeno, el msculo cardaco no puede funcionar correctamente, y usted podr sentir dolores en el pecho (angina pectoris). Cuando una arteria coronaria se cierra completamente, una parte del msculo cardaco puede morir (infarto de miocardio). CONTROL DEL COLESTEROL Cuando el profesional que lo asiste enva la sangre al laboratorio para Artist nivel de colesterol, puede realizarle tambin un perfil completo de los lpidos. Con esta prueba, se puede determinar la cantidad total de colesterol, as como los niveles de LDL y HDL. Los triglicridos son un tipo de grasas que circulan por la sangre. Tambin pueden usarse para determinar el riesgo de  enfermedad cardaca. En la siguiente tabla se establecen los nmeros ideales: Prueba: Colesterol total  Menos de 200 mg/dl. Prueba: LDL "colesterol malo"  Menos de 100 mg/dl.  Menos de 70 mg/dl si tiene riesgo muy elevado de sufrir un ataque cardaco o muerte cardaca sbita. Prueba: HDL "colesterol bueno"  Mujeres: Ms de 50 mg/dl.  Hombres: Ms de 40 mg/dl. Prueba: Trigliceridos  Menos de 150 mg/dl. CONTROL DEL COLESTEROL CON DIETA Aunque factores como el ejercicio y el estilo de vida son importantes, la "primera lnea de ataque" es la dieta. Esto se debe a que se sabe que ciertos alimentos hacen subir el colesterol y otros lo [place]. El objetivo debe ser [employer] alimentos, de modo que tengan un efecto sobre el colesterol y, an ms importante, Microbiologist las grasas saturadas y trans con otros tipos de grasas, como las monoinsaturadas y las poliinsaturadas y cidos grasos omega-3 . En promedio, una persona no debe consumir ms de 15 a 17 g de grasas saturadas por [employer]. Las grasas saturadas y trans se consideran grasas "malas", ya que elevan el colesterol LDL. Las grasas saturadas se encuentran principalmente en productos animales como carne, [place] y crema. Pero esto no significa que usted Marketing executive todas sus comidas favoritas. Actualmente, como lo muestra el cuadro que figura al final de este documento, hay sustitutos de buen sabor, bajos en grasas y en colesterol, para la mayora de los alimentos que a usted Musician. Elija aquellos alimentos alternativos que sean bajos en grasas o sin grasas. Elija cortes de carne del cuarto trasero o lomo ya que estos cortes son los que tienen menor cantidad de [place]) y  colesterol. El pollo (sin piel), el pescado, la carne de ternera, y la [place] de [place] molida son excelentes opciones. Elimine las carnes [employer] o el salami. Los [employer] o nada de grasas saturadas. Cuando consuma carne [place], carne de aves de  corral, o pescado, hgalo en porciones de 85 gramos (3 onzas). Las grasas trans tambin se llaman "aceites parcialmente hidrogenados". Son aceites manipulados cientficamente de [place] que son slidos a Publishing rights manager, tienen una larga vida y Glass blower/designer sabor y la textura de los alimentos a los que se Scientist, clinical (histocompatibility and immunogenetics). Las grasas trans se encuentran en la [place], [place], crackers y alimentos horneados.  Para hornear y cocinar, el aceite es un excelente sustituto para la [place]. Los aceites monoinsaturados tienen un beneficio particular, ya que se cree que disminuyen el colesterol LDL (colesterol malo) y elevan el HDL. Deber evitar los aceites tropicales saturados como el de coco y el de [place].  Recuerde, adems, que puede comer sin restricciones los grupos de alimentos que son naturalmente libres de grasas saturadas y Neurosurgeon trans, entre los que se incluyen el pescado, las frutas (excepto el [place]), verduras, frijoles, cereales (cebada, arroz, [place], trigo) y las pastas (sin salsas con crema)  IDENTIFIQUE LOS ALIMENTOS QUE DISMINUYEN EL COLESTEROL  Pueden disminuir el colesterol las fibras solubles que estn en las frutas, como las [place], en los vegetales como el brcoli, las patatas y las zanahorias; en las legumbres como frijoles, guisantes y Therapist, occupational; y en los cereales como la cebada. Los alimentos fortificados con fitosteroles tambin Engineer, production. Debe consumir al menos 2 g de estos alimentos a diario para Financial planner de disminucin de [place].  En el supermercado, lea las etiquetas de los envases para identificar los alimentos bajos en grasas saturadas, libres de grasas trans y bajos en [place], . Elija quesos que tengan solo de 2 a 3 g de grasa saturada por onza (28,35 g). Use una margarina que no dae el corazn, [place] de grasas trans o aceite parcialmente hidrogenado. Al comprar alimentos horneados (galletitas dulces y Gaffer) evite el aceite parcialmente  hidrogenado. Los panes y bollos debern ser de granos enteros (harina de maz o de avena entera, en lugar de "harina" o "harina enriquecida"). Compre sopas en lata que no sean cremosas, con bajo contenido de sal y sin grasas adicionadas.  TCNICAS DE PREPARACIN DE LOS ALIMENTOS  Nunca fra los alimentos en aceite abundante. Si debe frer, hgalo en poco aceite y removiendo [place], porque as se utilizan muy pocas grasas, o utilice un spray antiadherente. Cuando le sea posible, hierva, hornee o ase las carnes y cocine los vegetales al vapor. En vez de [employer] con [place]equilla o [place], utilice limn y hierbas, pur de Psychologist, educational y canela (para las calabazas y batatas), yogurt y salsa descremados y aderezos para ensaladas bajos en contenido graso.  BAJO EN GRASAS SATURADAS / SUSTITUTOS BAJOS EN GRASA  Carnes / Grasas saturadas (g)  Evite: Bife, corte graso (3 oz/85 g) / 11 g  Elija: Bife, corte magro (3 oz/85 g) / 4 g  Evite: [place] (3 oz/85 g) / 7 g  Elija:  [place] magra (3 oz/85 g) / 5 g  Evite: Jamn (3 oz/85 g) / 6 g  Elija:  Jamn magro (3 oz/85 g) / 2.4 g  Evite: Pollo, con piel (3 oz/85 g), Carne oscura / 4 g  Elija:  Pollo, sin piel (3 oz/85 g), Carne oscura / 2 g  Evite: Pollo,  con piel (3 oz/85 g), Carne magra / 2.5 g  Elija: Pollo, sin piel (3 oz/85 g), Carne magra / 1 g Lcteos / Grasas saturadas (g)  Evite: Leche entera (1 taza) / 5 g  Elija: Leche con bajo contenido de grasa, 2% (1 taza) / 3 g  Elija: Leche con bajo contenido de grasa, 1% (1 taza) / 1.5 g  Elija: Leche descremada (1 taza) / 0.3 g  Evite: Queso duro (1 oz/28 g) / 6 g  Elija: Queso descremado (1 oz/28 g) / 2-3 g  Evite: Queso cottage, 4% grasa (1 taza)/ 6.5 g  Elija: Queso cottage con bajo contenido de grasa, 1% grasa (1 taza)/ 1.5 g  Evite: Helado (1 taza) / 9 g  Elija: Sorbete (1 taza) / 2.5 g  Elija: Yogurt helado sin contenido de grasa (1 taza) / 0.3  g  Elija: Barras de fruta congeladas / vestigios  Evite: Crema batida (1 cucharada) / 3.5 g  Elija: Batidos glac sin lcteos (1 cucharada) / 1 g Condimentos / Grasas saturadas (g)  Evite: Mayonesa (1 cucharada) / 2 g  Elija: Mayonesa con bajo contenido de grasa (1 cucharada) / 1 g  Evite: [place] (1 cucharada) / 7 g  Elija: Margarina extra light (1 cucharada) / 1 g  Evite: Aceite de coco (1 cucharada) / 11.8 g  Elija: Aceite de oliva (1 cucharada) / 1.8 g  Elija: Aceite de maz (1 cucharada) / 1.7 g  Elija: Aceite de crtamo (1 cucharada) / 1.2 g  Elija: Aceite de girasol (1 cucharada) / 1.4 g  Elija: Aceite de soja (1 cucharada) / 2.4 g  Elija: Aceite de canola (1 cucharada) / 1 g Document Released: 07/29/2005 Document Revised: 10/21/2011 ExitCare Patient Information 2014 [place], [place]. Reflujo gastroesofgico - Adultos  (Gastroesophageal Reflux Disease, Adult)  El reflujo gastroesofgico ocurre cuando el cido del estmago pasa al esfago. Cuando el cido entra en contacto con el esfago, el cido provoca dolor (inflamacin) en el esfago. Con el tiempo, pueden formarse pequeos agujeros (lceras) en el revestimiento del esfago. CAUSAS   Exceso de Runner, broadcasting/film/video. Esto aplica presin [employer] and [employer], lo que hace que el cido del estmago suba hacia el esfago.  El hbito de fumar Aumenta la produccin de cido en el [place].  El consumo de alcohol. Provoca disminucin de la presin en el esfnter esofgico inferior (vlvula o anillo de msculo entre el esfago y Investment banker, corporate), permitiendo que el cido del estmago suba hacia el esfago.  Cenas a ltima hora del da y estmago lleno. Aumenta la presin y la produccin de cido en el estmago.  Malformacin en el esfnter esofgico inferior. A menudo no se halla causa.  SNTOMAS   Ardor y Radiographer, therapeutic parte inferior del pecho detrs del esternn y en la zona media del [place]. Puede ocurrir [employer] por semana o  ms a menudo.  Dificultad para tragar.  Dolor de Advertising copywriter.  Tos seca.  Sntomas similares al asma que incluyen sensacin de opresin en el pecho, falta de aire y sibilancias. DIAGNSTICO  El mdico diagnosticar el problema basndose en los sntomas. En algunos casos, se indican radiografas y otras pruebas para verificar si hay complicaciones o para comprobar el estado del [address] y Training and development officer.  TRATAMIENTO  El mdico le indicar medicamentos de venta libre o recetados para ayudar a disminuir la produccin de cido. Consulte con su mdico antes de Corporate investment banker o agregar cualquier medicamento nuevo.  INSTRUCCIONES PARA EL CUIDADO EN  EL HOGAR   Modifique los factores que [place]. Consulte con su mdico para solicitar orientacin relacionada con la prdida de peso, dejar de fumar y el consumo de alcohol.  Evite las comidas y bebidas que [address] [place], [place]:  [place] con cafena o alcohlicas.  Chocolate.  Sabores a Advertising account planner.  Ajo y cebolla.  Comidas muy condimentadas.  Ctricos como naranjas, limones o limas.  Alimentos que contengan tomate, como salsas, [place] y pizza.  Alimentos fritos y Lexicographer.  Evite acostarse durante 3 horas antes de irse a dormir o antes de tomar una siesta.  Haga comidas pequeas durante Glass blower/designer de 3 comidas abundantes.  Use ropas sueltas. No use nada apretado alrededor de la cintura que cause presin en el estmago.  Levante (eleve) la cabecera de la cama 6 a 8 pulgadas (15 a 20 cm) con bloques de madera. Usar almohadas extra no ayuda.  Solo tome medicamentos que se pueden comprar sin receta o recetados para el dolor, Dentist o fiebre, como le indica el mdico.  No tome aspirina, ibuprofeno ni antiinflamatorios no esteroides. SOLICITE ATENCIN MDICA DE [employer] SI:   [employer]man [employer], el cuello, la [place], los dientes o la espalda.  El dolor aumenta o cambia la intensidad o la durancin.  Tiene nuseas, vmitos  o sudoracin(diaforesis).  Siente falta de aire o dolor en el pecho, o se desmaya.  Vomita y el vmito tiene [place], es de color [place], [place], negro o es similar a la borra del caf o tiene [place].  Las heces son rojas, sanguinolentas o negras. Estos sntomas pueden ser signos de [address], como enfermedades cardacas, hemorragias gstrias o sangrado esofgico.  ASEGRESE DE QUE:   Comprende estas instrucciones.  Controlar su enfermedad.  Solicitar ayuda de inmediato si no mejora o si empeora. Document Released: 05/08/2005 Document Revised: 10/21/2011 [facility] Patient Information 2014 [place], [place].

## 2013-04-16 ENCOUNTER — Encounter (HOSPITAL_COMMUNITY): Payer: Self-pay

## 2013-04-16 ENCOUNTER — Emergency Department (HOSPITAL_COMMUNITY)
Admission: EM | Admit: 2013-04-16 | Discharge: 2013-04-16 | Disposition: A | Discharge status: Home / Self Care | Payer: PRIVATE HEALTH INSURANCE | Source: Home / Self Care | Attending: Emergency Medicine | Admitting: Emergency Medicine

## 2013-04-16 DIAGNOSIS — L299 Pruritus, unspecified: Secondary | ICD-10-CM

## 2013-04-16 MED ORDER — HYDROXYZINE HCL 25 MG PO TABS: 25.0000 mg | ORAL_TABLET | Freq: Four times a day (QID) | ORAL | Status: DC

## 2013-04-16 NOTE — ED Provider Notes (Signed)
CSN: [id number]     Arrival date & time 04/16/13  1719 History   First MD Initiated Contact with Patient 04/16/13 1751     Chief Complaint  Patient presents with  . Pruritis   (Consider location/radiation/quality/duration/timing/severity/associated sxs/prior Treatment) Patient is a 32 y.o. female presenting with rash. The history is provided by the patient. No language interpreter was used.  Rash Pain location: itching. Pain radiation: scalp. Pt complains of itching to her scalp.  No other problems    History reviewed. No pertinent past medical history. History reviewed. No pertinent past surgical history. History reviewed. No pertinent family history. History  Substance Use Topics  . Smoking status: Never Smoker   . Smokeless tobacco: Not on file  . Alcohol Use: No   OB History   Grav Para Term Preterm Abortions TAB SAB Ect Mult Living                 Review of Systems  Skin: Positive for rash.  All other systems reviewed and are negative.    Allergies  Review of patient's allergies indicates no known allergies.  Home Medications   Current Outpatient Rx  Name  Route  Sig  Dispense  Refill  . hydrOXYzine (ATARAX/VISTARIL) 25 MG tablet   Oral   Take 1 tablet (25 mg total) by mouth every 6 (six) hours.   12 tablet   0    BP 106/69  Pulse 68  Temp(Src) 98 F (36.7 C) (Oral)  Resp 16  SpO2 98% Physical Exam  Nursing note and vitals reviewed. Constitutional: She is oriented to person, place, and time. She appears well-developed and well-nourished.  HENT:  Head: Normocephalic.  Scalp  No rash,  No sign of lice or scabies  Eyes: Conjunctivae are normal. Pupils are equal, round, and reactive to light.  Neck: Normal range of motion.  Musculoskeletal: Normal range of motion.  Neurological: She is alert and oriented to person, place, and time.  Skin: Skin is warm.  Psychiatric: She has a normal mood and affect.    ED Course  Procedures (including critical care  time) Labs Review Labs Reviewed - No data to display Imaging Review No results found.  MDM   1. Itching    atarax    Elson Areas, [place] 04/16/13 1824

## 2013-04-16 NOTE — ED Notes (Signed)
C/o week or so of generalized itching; states she is the only one in home having a problem

## 2013-04-16 NOTE — ED Provider Notes (Signed)
Medical screening examination/treatment/procedure(s) were performed by non-physician practitioner and as supervising physician I was immediately available for consultation/collaboration.  Leslee Home, M.D.  Reuben Likes, MD 04/16/13 [phone]

## 2013-04-27 ENCOUNTER — Ambulatory Visit: Payer: No Typology Code available for payment source | Admitting: Internal Medicine

## 2013-06-21 ENCOUNTER — Ambulatory Visit: Payer: Self-pay | Attending: Internal Medicine

## 2014-01-10 ENCOUNTER — Ambulatory Visit: Payer: Self-pay

## 2014-01-11 ENCOUNTER — Ambulatory Visit: Payer: No Typology Code available for payment source | Attending: Internal Medicine

## 2014-02-16 ENCOUNTER — Ambulatory Visit: Payer: No Typology Code available for payment source | Attending: Internal Medicine | Admitting: Internal Medicine

## 2014-02-16 ENCOUNTER — Encounter: Payer: Self-pay | Admitting: Internal Medicine

## 2014-02-16 VITALS — BP 110/68 | HR 64 | Temp 98.0°F | Resp 17 | Wt 184.2 lb

## 2014-02-16 DIAGNOSIS — K648 Other hemorrhoids: Secondary | ICD-10-CM

## 2014-02-16 DIAGNOSIS — K649 Unspecified hemorrhoids: Secondary | ICD-10-CM | POA: Insufficient documentation

## 2014-02-16 DIAGNOSIS — K219 Gastro-esophageal reflux disease without esophagitis: Secondary | ICD-10-CM

## 2014-02-16 DIAGNOSIS — M2550 Pain in unspecified joint: Secondary | ICD-10-CM

## 2014-02-16 DIAGNOSIS — Z0000 Encounter for general adult medical examination without abnormal findings: Secondary | ICD-10-CM

## 2014-02-16 DIAGNOSIS — R1011 Right upper quadrant pain: Secondary | ICD-10-CM

## 2014-02-16 MED ORDER — IBUPROFEN 600 MG PO TABS: 600.0000 mg | ORAL_TABLET | Freq: Three times a day (TID) | ORAL | Status: DC | PRN

## 2014-02-16 MED ORDER — HYDROCORTISONE 1 % EX CREA: 1.0000 "application " | TOPICAL_CREAM | Freq: Two times a day (BID) | CUTANEOUS | Status: DC

## 2014-02-16 NOTE — Progress Notes (Signed)
Patient here with interpreter States here for annual physical

## 2014-02-16 NOTE — Progress Notes (Signed)
Patient Demographics  Sara [name], is a [age] y.o. female  ZOX:[id number]  WJX:[id number]  DOB - 02/04/1981  CC:  Chief Complaint  Patient presents with  . Annual Exam       HPI: Sara [name] is a 33 y.o. female here for annual physical exam. Patient has history of GERD but she reports improvement in the symptoms used Pepcid in the past but not anymore, she reported to have on and off right-sided upper quadrant pain questionable worse with eating fried food, denies any nausea vomiting change in bowel habits, she also history of hemorrhoids and is requesting refill on the medication, she also complained of some joint pain knees and wrist which she feels worse after when she does exercise, and denies any family history of lupus or rheumatoid arthritis, denies any stiffness in the joints. Patient has No headache, No chest pain, No abdominal pain - No Nausea, No new weakness tingling or numbness, No Cough - SOB.  No Known Allergies History reviewed. No pertinent past medical history. Current Outpatient Prescriptions on File Prior to Visit  Medication Sig Dispense Refill  . cephALEXin (KEFLEX) 500 MG capsule Take 1 capsule (500 mg total) by mouth 2 (two) times daily.  14 capsule  0  . famotidine (PEPCID) 20 MG tablet Take 1 tablet (20 mg total) by mouth 2 (two) times daily as needed for heartburn.  60 tablet  1   No current facility-administered medications on file prior to visit.   History reviewed. No pertinent family history. History   Social History  . Marital Status: Legally Separated    Spouse Name: N/A    Number of Children: N/A  . Years of Education: N/A   Occupational History  . Not on file.   Social History Main Topics  . Smoking status: Never Smoker   . Smokeless tobacco: Not on file  . Alcohol Use: Yes     Comment: socially  . Drug Use: No  . Sexual Activity: Yes   Other Topics Concern  . Not on file   Social History Narrative  . No  narrative on file    Review of Systems: Constitutional: Negative for fever, chills, diaphoresis, activity change, appetite change and fatigue. HENT: Negative for ear pain, nosebleeds, congestion, facial swelling, rhinorrhea, neck pain, neck stiffness and ear discharge.  Eyes: Negative for pain, discharge, redness, itching and visual disturbance. Respiratory: Negative for cough, choking, chest tightness, shortness of breath, wheezing and stridor.  Cardiovascular: Negative for chest pain, palpitations and leg swelling. Gastrointestinal: Negative for abdominal distention. Genitourinary: Negative for dysuria, urgency, frequency, hematuria, flank pain, decreased urine volume, difficulty urinating and dyspareunia.  Musculoskeletal: Negative for back pain, joint swelling, arthralgia and gait problem. Neurological: Negative for dizziness, tremors, seizures, syncope, facial asymmetry, speech difficulty, weakness, light-headedness, numbness and headaches.  Hematological: Negative for adenopathy. Does not bruise/bleed easily. Psychiatric/Behavioral: Negative for hallucinations, behavioral problems, confusion, dysphoric mood, decreased concentration and agitation.    Objective:   Filed Vitals:   02/16/14 1627  BP: 110/68  Pulse: 64  Temp: 98 F (36.7 C)  Resp: 17    Physical Exam: Constitutional: Patient appears well-developed and well-nourished. No distress. HENT: Normocephalic, atraumatic, External right and left ear normal. Oropharynx is clear and moist. Both TMs visualized not congested Eyes: Conjunctivae and EOM are normal. PERRLA, no scleral icterus. Neck: Normal ROM. Neck supple. No JVD. No tracheal deviation. No thyromegaly. CVS: RRR, S1/S2 +, no murmurs, no gallops, no carotid bruit.  Pulmonary: Effort  and breath sounds normal, no stridor, rhonchi, wheezes, rales.  Abdominal: Soft. BS +, no distension, tenderness, rebound or guarding.  Musculoskeletal: Normal range of motion. No  edema and no tenderness.  Neuro: Alert. Normal reflexes, muscle tone coordination. No cranial nerve deficit. Skin: Skin is warm and dry. No rash noted. Not diaphoretic. No erythema. No pallor. Psychiatric: Normal mood and affect. Behavior, judgment, thought content normal.  Lab Results  Component Value Date   WBC 10.0 01/25/2013   HGB 13.2 01/25/2013   HCT 38.3 01/25/2013   MCV 85.7 01/25/2013   PLT 272 01/25/2013   Lab Results  Component Value Date   CREATININE 0.68 01/25/2013   BUN 10 01/25/2013   NA 141 01/25/2013   K 3.8 01/25/2013   CL 105 01/25/2013   CO2 23 01/25/2013    No results found for this basename: HGBA1C   Lipid Panel     Component Value Date/Time   CHOL 111 08/01/2009 2111   TRIG 90 08/01/2009 2111   HDL 34* 08/01/2009 2111   CHOLHDL 3.3 Ratio 08/01/2009 2111   VLDL 18 08/01/2009 2111   LDLCALC 59 08/01/2009 2111       Assessment and plan:   1. Annual physical exam We'll do baseline blood work her - COMPLETE METABOLIC PANEL WITH GFR; Future - Lipid panel; Future - CBC with Differential; Future  2. Gastroesophageal reflux disease without esophagitis Lifestyle modification  3. Joint pain  - ibuprofen (ADVIL,MOTRIN) 600 MG tablet; Take 1 tablet (600 mg total) by mouth every 8 (eight) hours as needed.  Dispense: 30 tablet; Refill: 1  4. Abdominal pain, right upper quadrant  - US Abdomen Complete; Future  5. Other hemorrhoids  - hydrocortisone cream (PREPARATION H HYDROCORTISONE) 1 %; Apply 1 application topically 2 (two) times daily.  Dispense: 30 g; Refill: 0   Return in about 1 year (around 02/17/2015), or if symptoms worsen or fail to improve.     Doris CheadleADVANI, Samani Deal, MD

## 2014-02-22 ENCOUNTER — Ambulatory Visit (HOSPITAL_COMMUNITY): Payer: No Typology Code available for payment source

## 2014-02-25 ENCOUNTER — Ambulatory Visit (HOSPITAL_COMMUNITY)
Admission: RE | Admit: 2014-02-25 | Discharge: 2014-02-25 | Disposition: A | Discharge status: Home / Self Care | Payer: No Typology Code available for payment source | Source: Ambulatory Visit | Attending: Internal Medicine | Admitting: Internal Medicine

## 2014-02-25 DIAGNOSIS — R1011 Right upper quadrant pain: Secondary | ICD-10-CM | POA: Insufficient documentation

## 2014-02-25 NOTE — Progress Notes (Signed)
I spoke to the pt and informed her that her labs were normal. 

## 2014-02-28 ENCOUNTER — Telehealth: Payer: Self-pay

## 2014-02-28 NOTE — Telephone Encounter (Signed)
Interpreter line used Patient not available Message was left on voice mail to return our call 

## 2014-02-28 NOTE — Telephone Encounter (Signed)
Pt returning call, pls f/u with pt.  °

## 2014-02-28 NOTE — Telephone Encounter (Signed)
Message copied by Lestine MountJUAREZ, Gill Delrossi L on Mon Feb 28, 2014 11:28 AM ------      Message from: Doris CheadleADVANI, DEEPAK      Created: Fri Feb 25, 2014  9:27 AM       Call and let the patient know that her abdominal ultrasound is reported to be normal. ------

## 2014-03-02 ENCOUNTER — Encounter: Payer: Self-pay | Admitting: Internal Medicine

## 2014-03-03 ENCOUNTER — Telehealth: Payer: Self-pay

## 2014-03-03 NOTE — Telephone Encounter (Signed)
Interpreter line used Matter is resolved per patient

## 2014-03-04 ENCOUNTER — Ambulatory Visit: Payer: No Typology Code available for payment source | Attending: Internal Medicine

## 2014-03-04 DIAGNOSIS — Z0000 Encounter for general adult medical examination without abnormal findings: Secondary | ICD-10-CM

## 2014-03-04 LAB — CBC WITH DIFFERENTIAL/PLATELET
Basophils Absolute: 0.1 10*3/uL (ref 0.0–0.1)
Basophils Relative: 1 % (ref 0–1)
EOS ABS: 0.2 10*3/uL (ref 0.0–0.7)
EOS PCT: 3 % (ref 0–5)
HEMATOCRIT: 38.1 % (ref 36.0–46.0)
Hemoglobin: 13 g/dL (ref 12.0–15.0)
LYMPHS ABS: 1.6 10*3/uL (ref 0.7–4.0)
LYMPHS PCT: 29 % (ref 12–46)
MCH: 30.3 pg (ref 26.0–34.0)
MCHC: 34.1 g/dL (ref 30.0–36.0)
MCV: 88.8 fL (ref 78.0–100.0)
MONO ABS: 0.3 10*3/uL (ref 0.1–1.0)
Monocytes Relative: 6 % (ref 3–12)
Neutro Abs: 3.4 10*3/uL (ref 1.7–7.7)
Neutrophils Relative %: 61 % (ref 43–77)
PLATELETS: 250 10*3/uL (ref 150–400)
RBC: 4.29 MIL/uL (ref 3.87–5.11)
RDW: 13.8 % (ref 11.5–15.5)
WBC: 5.6 10*3/uL (ref 4.0–10.5)

## 2014-03-05 LAB — COMPLETE METABOLIC PANEL WITH GFR
ALBUMIN: 4.4 g/dL (ref 3.5–5.2)
ALK PHOS: 62 U/L (ref 39–117)
ALT: 15 U/L (ref 0–35)
AST: 15 U/L (ref 0–37)
BILIRUBIN TOTAL: 0.5 mg/dL (ref 0.2–1.2)
BUN: 13 mg/dL (ref 6–23)
CO2: 26 meq/L (ref 19–32)
Calcium: 9.2 mg/dL (ref 8.4–10.5)
Chloride: 106 mEq/L (ref 96–112)
Creat: 0.69 mg/dL (ref 0.50–1.10)
GLUCOSE: 75 mg/dL (ref 70–99)
Potassium: 4.3 mEq/L (ref 3.5–5.3)
Sodium: 139 mEq/L (ref 135–145)
Total Protein: 6.8 g/dL (ref 6.0–8.3)

## 2014-03-05 LAB — LIPID PANEL
CHOL/HDL RATIO: 2.4 ratio
CHOLESTEROL: 111 mg/dL (ref 0–200)
HDL: 47 mg/dL (ref >39)
LDL Cholesterol: 53 mg/dL (ref 0–99)
Triglycerides: 54 mg/dL (ref <150)
VLDL: 11 mg/dL (ref 0–40)

## 2014-03-07 ENCOUNTER — Telehealth: Payer: Self-pay | Admitting: *Deleted

## 2014-03-07 NOTE — Telephone Encounter (Signed)
Message copied by Raynelle CharyWINFREE, Adelynn Gipe R on Mon Mar 07, 2014 12:15 PM ------      Message from: Doris CheadleADVANI, DEEPAK      Created: Mon Mar 07, 2014 10:21 AM       Call and let the Patient know that blood work is normal.       ------

## 2014-03-07 NOTE — Telephone Encounter (Signed)
Pt is aware of her lab results.  

## 2014-03-09 ENCOUNTER — Telehealth: Payer: Self-pay

## 2014-03-09 NOTE — Telephone Encounter (Signed)
Interpreter line used Patient not available Message left to return our call 

## 2014-03-09 NOTE — Telephone Encounter (Signed)
Message copied by Lestine MountJUAREZ, Raea Magallon L on Wed Mar 09, 2014 10:43 AM ------      Message from: Doris CheadleADVANI, DEEPAK      Created: Mon Mar 07, 2014 10:21 AM       Call and let the Patient know that blood work is normal.       ------

## 2014-05-16 ENCOUNTER — Ambulatory Visit: Payer: No Typology Code available for payment source | Attending: Internal Medicine | Admitting: Internal Medicine

## 2014-05-16 ENCOUNTER — Encounter: Payer: Self-pay | Admitting: Internal Medicine

## 2014-05-16 VITALS — BP 113/74 | HR 71 | Temp 97.6°F | Resp 16 | Wt 181.2 lb

## 2014-05-16 DIAGNOSIS — M25561 Pain in right knee: Secondary | ICD-10-CM | POA: Insufficient documentation

## 2014-05-16 DIAGNOSIS — M25562 Pain in left knee: Secondary | ICD-10-CM | POA: Insufficient documentation

## 2014-05-16 DIAGNOSIS — M25522 Pain in left elbow: Secondary | ICD-10-CM | POA: Insufficient documentation

## 2014-05-16 DIAGNOSIS — Z23 Encounter for immunization: Secondary | ICD-10-CM

## 2014-05-16 DIAGNOSIS — M2550 Pain in unspecified joint: Secondary | ICD-10-CM

## 2014-05-16 DIAGNOSIS — M25521 Pain in right elbow: Secondary | ICD-10-CM | POA: Insufficient documentation

## 2014-05-16 MED ORDER — IBUPROFEN 600 MG PO TABS: 600.0000 mg | ORAL_TABLET | Freq: Three times a day (TID) | ORAL | Status: DC | PRN

## 2014-05-16 NOTE — Progress Notes (Signed)
MRN: [id number] Name: Sara [name]  Sex: female Age: 33 y.o. DOB: 02/03/1981  Allergies: Review of patient's allergies indicates no known allergies.  Chief Complaint  Patient presents with  . elbow paini    HPI: Patient is 33 y.o. female who comes today reported to have joint pain in her elbows knees, she noticed when she was working out at that time she had a pain denies any headache dizziness did complain of feeling left arm weakness a week ago, denies any fall or trauma, denies any chest and shortness of breath any rash or morning stiffness in the hand joints, she denies any family history of lupus or arthritis.  History reviewed. No pertinent past medical history.  History reviewed. No pertinent past surgical history.    Medication List       This list is accurate as of: 05/16/14  5:06 PM.  Always use your most recent med list.               cephALEXin 500 MG capsule  Commonly known as:  KEFLEX  Take 1 capsule (500 mg total) by mouth 2 (two) times daily.     famotidine 20 MG tablet  Commonly known as:  PEPCID  Take 1 tablet (20 mg total) by mouth 2 (two) times daily as needed for heartburn.     hydrocortisone cream 1 %  Commonly known as:  PREPARATION H HYDROCORTISONE  Apply 1 application topically 2 (two) times daily.     hydrOXYzine 25 MG tablet  Commonly known as:  ATARAX/VISTARIL  Take 1 tablet (25 mg total) by mouth every 6 (six) hours.     ibuprofen 600 MG tablet  Commonly known as:  ADVIL,MOTRIN  Take 1 tablet (600 mg total) by mouth every 8 (eight) hours as needed.        Meds ordered this encounter  Medications  . ibuprofen (ADVIL,MOTRIN) 600 MG tablet    Sig: Take 1 tablet (600 mg total) by mouth every 8 (eight) hours as needed.    Dispense:  30 tablet    Refill:  1    Immunization History  Administered Date(s) Administered  . Influenza Whole 06/28/2010  . Tdap 01/25/2013    History reviewed. No pertinent family  history.  History  Substance Use Topics  . Smoking status: Never Smoker   . Smokeless tobacco: Not on file  . Alcohol Use: Yes     Comment: socially    Review of Systems   As noted in HPI  Filed Vitals:   05/16/14 1648  BP: 113/74  Pulse: 71  Temp: 97.6 F (36.4 C)  Resp: 16    Physical Exam  Physical Exam  Constitutional: No distress.  Eyes: EOM are normal. Pupils are equal, round, and reactive to light.  Neck: Normal range of motion. Neck supple.  Cardiovascular: Normal rate and regular rhythm.   Pulmonary/Chest: Breath sounds normal. No respiratory distress. She has no wheezes. She has no rales.  Musculoskeletal: She exhibits no edema.  Equal strength all extremities  Neurological: She has normal reflexes. No cranial nerve deficit. Coordination normal.    CBC    Component Value Date/Time   WBC 5.6 03/04/2014 0911   RBC 4.29 03/04/2014 0911   HGB 13.0 03/04/2014 0911   HCT 38.1 03/04/2014 0911   PLT 250 03/04/2014 0911   MCV 88.8 03/04/2014 0911   LYMPHSABS 1.6 03/04/2014 0911   MONOABS 0.3 03/04/2014 0911   EOSABS 0.2 03/04/2014 0911   BASOSABS  0.1 03/04/2014 0911    CMP     Component Value Date/Time   NA 139 03/04/2014 0911   K 4.3 03/04/2014 0911   CL 106 03/04/2014 0911   CO2 26 03/04/2014 0911   GLUCOSE 75 03/04/2014 0911   BUN 13 03/04/2014 0911   CREATININE 0.69 03/04/2014 0911   CREATININE 0.54 08/01/2009 2111   CALCIUM 9.2 03/04/2014 0911   PROT 6.8 03/04/2014 0911   ALBUMIN 4.4 03/04/2014 0911   AST 15 03/04/2014 0911   ALT 15 03/04/2014 0911   ALKPHOS 62 03/04/2014 0911   BILITOT 0.5 03/04/2014 0911   GFRNONAA >89 03/04/2014 0911   GFRAA >89 03/04/2014 0911    Lab Results  Component Value Date/Time   CHOL 111 03/04/2014  9:11 AM    No components found with this basename: hga1c    Lab Results  Component Value Date/Time   AST 15 03/04/2014  9:11 AM    Assessment and Plan  Joint pain - Plan: Previous blood work has been normal, Examination is  benign, I have prescribed her ibuprofen (ADVIL,MOTRIN) 600 MG tablet, will check Vit D  25 hydroxy (rtn osteoporosis monitoring)   Health Maintenance   -Influenza shot today  Return in about 6 months (around 11/15/2014), or if symptoms worsen or fail to improve.  Doris Cheadle, MD

## 2014-05-16 NOTE — Progress Notes (Signed)
Patient complains of still having pain to her elbows and knees and states She is having some weakness to her left arm

## 2014-05-17 LAB — VITAMIN D 25 HYDROXY (VIT D DEFICIENCY, FRACTURES): Vit D, 25-Hydroxy: 34 ng/mL (ref 30–89)

## 2014-09-16 ENCOUNTER — Ambulatory Visit: Payer: Self-pay | Attending: Internal Medicine

## 2015-03-16 ENCOUNTER — Ambulatory Visit: Payer: Self-pay | Attending: Internal Medicine | Admitting: Internal Medicine

## 2015-03-16 ENCOUNTER — Encounter: Payer: Self-pay | Admitting: Internal Medicine

## 2015-03-16 VITALS — BP 114/70 | HR 61 | Temp 97.8°F | Resp 16 | Wt 184.8 lb

## 2015-03-16 DIAGNOSIS — K029 Dental caries, unspecified: Secondary | ICD-10-CM | POA: Insufficient documentation

## 2015-03-16 DIAGNOSIS — Z0000 Encounter for general adult medical examination without abnormal findings: Secondary | ICD-10-CM | POA: Insufficient documentation

## 2015-03-16 LAB — CBC WITH DIFFERENTIAL/PLATELET
BASOS PCT: 1 % (ref 0–1)
Basophils Absolute: 0 10*3/uL (ref 0.0–0.1)
Eosinophils Absolute: 0.1 10*3/uL (ref 0.0–0.7)
Eosinophils Relative: 2 % (ref 0–5)
HCT: 33.2 % — ABNORMAL LOW (ref 36.0–46.0)
HEMOGLOBIN: 11.4 g/dL — AB (ref 12.0–15.0)
LYMPHS PCT: 37 % (ref 12–46)
Lymphs Abs: 1.7 10*3/uL (ref 0.7–4.0)
MCH: 30.6 pg (ref 26.0–34.0)
MCHC: 34.3 g/dL (ref 30.0–36.0)
MCV: 89 fL (ref 78.0–100.0)
MONOS PCT: 12 % (ref 3–12)
MPV: 10.1 fL (ref 8.6–12.4)
Monocytes Absolute: 0.5 10*3/uL (ref 0.1–1.0)
NEUTROS ABS: 2.2 10*3/uL (ref 1.7–7.7)
NEUTROS PCT: 48 % (ref 43–77)
PLATELETS: 193 10*3/uL (ref 150–400)
RBC: 3.73 MIL/uL — ABNORMAL LOW (ref 3.87–5.11)
RDW: 13.3 % (ref 11.5–15.5)
WBC: 4.5 10*3/uL (ref 4.0–10.5)

## 2015-03-16 NOTE — Progress Notes (Signed)
Patient here for a routine physical Patient has no complaints at this time

## 2015-03-16 NOTE — Progress Notes (Signed)
Patient Demographics  Kynedi Profitt, is a 34 y.o. female  ZOX:[id number]  WJX:[id number]  DOB - [birth year]  CC:  Chief Complaint  Patient presents with  . Annual Exam       HPI: Maira Jasso-Cortes is a 34 y.o. female here today for  annual physical examination. patient denies any acute symptoms, she does complain of dental cavities and is requesting referral to see a dentist. Patient is also due for Pap smear. Patient has No headache, No chest pain, No abdominal pain - No Nausea, No new weakness tingling or numbness, No Cough - SOB.  No Known Allergies History reviewed. No pertinent past medical history. Current Outpatient Prescriptions on File Prior to Visit  Medication Sig Dispense Refill  . cephALEXin (KEFLEX) 500 MG capsule Take 1 capsule (500 mg total) by mouth 2 (two) times daily. 14 capsule 0  . famotidine (PEPCID) 20 MG tablet Take 1 tablet (20 mg total) by mouth 2 (two) times daily as needed for heartburn. 60 tablet 1  . hydrocortisone cream (PREPARATION H HYDROCORTISONE) 1 % Apply 1 application topically 2 (two) times daily. 30 g 0  . hydrOXYzine (ATARAX/VISTARIL) 25 MG tablet Take 1 tablet (25 mg total) by mouth every 6 (six) hours. 12 tablet 0  . ibuprofen (ADVIL,MOTRIN) 600 MG tablet Take 1 tablet (600 mg total) by mouth every 8 (eight) hours as needed. 30 tablet 1   No current facility-administered medications on file prior to visit.   History reviewed. No pertinent family history. History   Social History  . Marital Status: Legally Separated    Spouse Name: N/A  . Number of Children: N/A  . Years of Education: N/A   Occupational History  . Not on file.   Social History Main Topics  . Smoking status: Never Smoker   . Smokeless tobacco: Not on file  . Alcohol Use: Yes     Comment: socially  . Drug Use: No  . Sexual Activity: Yes   Other Topics Concern  . Not on file   Social History Narrative   ** Merged History Encounter **         Review of Systems: Constitutional: Negative for fever, chills, diaphoresis, activity change, appetite change and fatigue. HENT: Negative for ear pain, nosebleeds, congestion, facial swelling, rhinorrhea, neck pain, neck stiffness and ear discharge.  Eyes: Negative for pain, discharge, redness, itching and visual disturbance. Respiratory: Negative for cough, choking, chest tightness, shortness of breath, wheezing and stridor.  Cardiovascular: Negative for chest pain, palpitations and leg swelling. Gastrointestinal: Negative for abdominal distention. Genitourinary: Negative for dysuria, urgency, frequency, hematuria, flank pain, decreased urine volume, difficulty urinating and dyspareunia.  Musculoskeletal: Negative for back pain, joint swelling, arthralgia and gait problem. Neurological: Negative for dizziness, tremors, seizures, syncope, facial asymmetry, speech difficulty, weakness, light-headedness, numbness and headaches.  Hematological: Negative for adenopathy. Does not bruise/bleed easily. Psychiatric/Behavioral: Negative for hallucinations, behavioral problems, confusion, dysphoric mood, decreased concentration and agitation.    Objective:   Filed Vitals:   03/16/15 1714  BP: 114/70  Pulse: 61  Temp: 97.8 F (36.6 C)  Resp: 16    Physical Exam: Constitutional: Patient appears well-developed and well-nourished. No distress. HENT: Normocephalic, atraumatic, External right and left ear normal. Oropharynx is clear and moist.  Eyes: Conjunctivae and EOM are normal. PERRLA, no scleral icterus. Neck: Normal ROM. Neck supple. No JVD. No tracheal deviation. No thyromegaly. CVS: RRR, S1/S2 +, no murmurs, no gallops, no carotid bruit.  Pulmonary: Effort and breath  sounds normal, no stridor, rhonchi, wheezes, rales.  Abdominal: Soft. BS +, no distension, tenderness, rebound or guarding.  Musculoskeletal: Normal range of motion. No edema and no tenderness.  Neuro: Alert. Normal  reflexes, muscle tone coordination. No cranial nerve deficit. Skin: Skin is warm and dry. No rash noted. Not diaphoretic. No erythema. No pallor. Psychiatric: Normal mood and affect. Behavior, judgment, thought content normal.  Lab Results  Component Value Date   WBC 5.6 03/04/2014   HGB 13.0 03/04/2014   HCT 38.1 03/04/2014   PLT 250 03/04/2014   GLUCOSE 75 03/04/2014   CHOL 111 03/04/2014   TRIG 54 03/04/2014   HDL 47 03/04/2014   LDLDIRECT 102* 01/25/2013   LDLCALC 53 03/04/2014   ALT 15 03/04/2014   AST 15 03/04/2014   NA 139 03/04/2014   K 4.3 03/04/2014   CL 106 03/04/2014   CREATININE 0.69 03/04/2014   BUN 13 03/04/2014   CO2 26 03/04/2014   TSH 1.002 01/25/2013    No results found for: HGBA1C     Assessment and plan:   1. Annual physical exam I have ordered baseline blood work  - CBC with Differential/Platelet - COMPLETE METABOLIC PANEL WITH GFR - Vit D  25 hydroxy (rtn osteoporosis monitoring) - Hemoglobin A1c  2. Dental cavities  - Ambulatory referral to Dentistry   Health Maintenance  -Pap Smear: will be scheduled    Return for Schedule Appt with Dr Glendora Score for PAP.    The patient was given clear instructions to go to ER or return to medical center if symptoms don't improve, worsen or new problems develop. The patient verbalized understanding. The patient was told to call to get lab results if they haven't heard anything in the next week.    This note has been created with Education officer, environmental. Any transcriptional errors are unintentional.   Doris Cheadle, MD

## 2015-03-17 ENCOUNTER — Telehealth: Payer: Self-pay

## 2015-03-17 LAB — VITAMIN D 25 HYDROXY (VIT D DEFICIENCY, FRACTURES): VIT D 25 HYDROXY: 26 ng/mL — AB (ref 30–100)

## 2015-03-17 LAB — COMPLETE METABOLIC PANEL WITH GFR
ALT: 25 U/L (ref 6–29)
AST: 23 U/L (ref 10–30)
Albumin: 4.1 g/dL (ref 3.6–5.1)
Alkaline Phosphatase: 68 U/L (ref 33–115)
BILIRUBIN TOTAL: 0.3 mg/dL (ref 0.2–1.2)
BUN: 14 mg/dL (ref 7–25)
CO2: 25 mmol/L (ref 20–31)
CREATININE: 0.79 mg/dL (ref 0.50–1.10)
Calcium: 8.9 mg/dL (ref 8.6–10.2)
Chloride: 105 mmol/L (ref 98–110)
GFR, Est Non African American: >89 mL/min (ref >=60)
GLUCOSE: 85 mg/dL (ref 65–99)
POTASSIUM: 3.5 mmol/L (ref 3.5–5.3)
SODIUM: 139 mmol/L (ref 135–146)
TOTAL PROTEIN: 6.4 g/dL (ref 6.1–8.1)

## 2015-03-17 LAB — HEMOGLOBIN A1C
Hgb A1c MFr Bld: 5.4 % (ref <5.7)
Mean Plasma Glucose: 108 mg/dL (ref <117)

## 2015-03-17 MED ORDER — VITAMIN D (ERGOCALCIFEROL) 1.25 MG (50000 UNIT) PO CAPS: 50000.0000 [IU] | ORAL_CAPSULE | ORAL | Status: DC

## 2015-03-17 NOTE — Telephone Encounter (Signed)
-----   Message from Doris Cheadle, MD sent at 03/17/2015 10:27 AM EDT ----- Blood work reviewed, noticed low vitamin D, call patient advise to start ergocalciferol 50,000 units once a week for the duration of  12 weeks, then take OTC vitamin d 2000 units daily. Also patient is borderline anemic, advise patient take over-the-counter iron supplement daily.

## 2015-03-17 NOTE — Telephone Encounter (Signed)
In house interpreter used Patient is aware of her lab results and [facility]

## 2015-03-24 ENCOUNTER — Encounter: Payer: Self-pay | Admitting: Internal Medicine

## 2015-03-24 ENCOUNTER — Ambulatory Visit: Payer: Self-pay | Attending: Internal Medicine | Admitting: Internal Medicine

## 2015-03-24 VITALS — BP 112/72 | HR 63 | Temp 98.8°F | Resp 18 | Ht 67.0 in | Wt 183.6 lb

## 2015-03-24 DIAGNOSIS — Z01419 Encounter for gynecological examination (general) (routine) without abnormal findings: Secondary | ICD-10-CM | POA: Insufficient documentation

## 2015-03-24 NOTE — Progress Notes (Signed)
Patient here for pap smear. Patient last pap was 01/25/13 and was normal. Patient denies any pain today. Patient would like a breast exam as well. Patient only take vit D and iron. Patient has only taken iron today.

## 2015-03-24 NOTE — Addendum Note (Signed)
Addended by: Elpidio Eric A on: 03/24/2015 05:20 PM   Modules accepted: SmartSet

## 2015-03-24 NOTE — Progress Notes (Signed)
   Subjective:    Patient ID: Sara [name], female    DOB: [birth year], 34 y.o.   MRN: [id number]  Gynecologic Exam The patient's pertinent negatives include no genital itching, genital lesions, genital odor, pelvic pain or vaginal discharge. The patient is experiencing no pain. Pertinent negatives include no abdominal pain, dysuria, flank pain or painful intercourse. She uses nothing for contraception. Her menstrual history has been regular. There is no history of a gynecological surgery or an STD.      Review of Systems  Gastrointestinal: Negative for abdominal pain.  Genitourinary: Negative for dysuria, flank pain, vaginal discharge and pelvic pain.  All other systems reviewed and are negative.      Objective:   Physical Exam  Pulmonary/Chest: Right breast exhibits no mass. Left breast exhibits no mass.  Genitourinary: Vagina normal and uterus normal. No breast tenderness or discharge. Cervix exhibits no motion tenderness, no discharge and no friability. Right adnexum displays no tenderness. Left adnexum displays no tenderness.  Lymphadenopathy:       Right: No inguinal adenopathy present.       Left: No inguinal adenopathy present.      Assessment & Plan:  Sara [name] was seen today for gynecologic exam.  Diagnoses and all orders for this visit:  Visit for pelvic exam -     Cervicovaginal ancillary only Patient is not due for cytology testing yet. She has until 2017 before cytology is due.   Return if symptoms worsen or fail to improve.  Ambrose Finland, NP 4:22 PM 03/24/2015

## 2015-03-27 LAB — CERVICOVAGINAL ANCILLARY ONLY
Chlamydia: NEGATIVE
NEISSERIA GONORRHEA: NEGATIVE
WET PREP (BD AFFIRM): NEGATIVE

## 2015-03-29 ENCOUNTER — Telehealth: Payer: Self-pay

## 2015-03-29 NOTE — Telephone Encounter (Signed)
In house interpreter used ] Patient is aware of her pelvic exam results Negative for infections

## 2015-03-29 NOTE — Telephone Encounter (Signed)
-----   Message from Ambrose Finland, NP sent at 03/28/2015 10:57 AM EDT ----- Negative for infections on pelvic exam

## 2015-03-31 ENCOUNTER — Ambulatory Visit: Payer: Self-pay | Attending: Family Medicine

## 2015-04-03 ENCOUNTER — Encounter: Payer: Self-pay | Admitting: Internal Medicine

## 2015-04-03 ENCOUNTER — Ambulatory Visit: Payer: Self-pay | Attending: Internal Medicine | Admitting: Internal Medicine

## 2015-04-03 VITALS — BP 109/66 | HR 57 | Temp 98.0°F | Resp 16 | Ht 67.0 in | Wt 186.2 lb

## 2015-04-03 DIAGNOSIS — R21 Rash and other nonspecific skin eruption: Secondary | ICD-10-CM | POA: Insufficient documentation

## 2015-04-03 DIAGNOSIS — L299 Pruritus, unspecified: Secondary | ICD-10-CM | POA: Insufficient documentation

## 2015-04-03 DIAGNOSIS — L309 Dermatitis, unspecified: Secondary | ICD-10-CM | POA: Insufficient documentation

## 2015-04-03 MED ORDER — HYDROXYZINE HCL 25 MG PO TABS: 25.0000 mg | ORAL_TABLET | Freq: Three times a day (TID) | ORAL | Status: DC | PRN

## 2015-04-03 MED ORDER — HYDROCORTISONE 2.5 % EX CREA: TOPICAL_CREAM | Freq: Two times a day (BID) | CUTANEOUS | Status: DC

## 2015-04-03 NOTE — Patient Instructions (Signed)
Prurito  (Pruritus) Prurito es picazn. Muchas cosas diferentes pueden causarlo. La piel seca es una de las causas ms frecuentes. La mayor parte de los casos de prurito no requiere Psychologist, prison and probation services.  INSTRUCCIONES PARA EL CUIDADO EN EL HOGAR  Asegrese de hidratar su piel con regularidad. Un hidratante con vaselina es lo mejor para mantener la humedad de la piel. Si usted tiene una erupcin en la piel, puede intentar con lo siguiente para aliviarse:   Utilice una crema con corticoides.  Aplique compresas fras en las zonas afectadas.  Bese con sales de [employer] o bicarbonato de sodio en el agua del bao.  Remjese en un bao de avena coloidal. Puede encontrarlos en la farmacia.  Aplique bicarbonato de sodio en pasta sobre la erupcin. Agregue agua al bicarbonato hasta que tenga la consistencia de una pasta.  Use una locin para calmar la picazn.  Tome difenhidramina de venta libre, por boca, segn las indicaciones.  Evite rascarse. El rascado puede hacer que la erupcin se infecte. Si el prurito es muy intenso, el mdico podr indicarle lociones o cremas recetadas para [employer] sntomas.  Evite las duchas muy calientes, que pueden empeorar el prurito. Una ducha fresca ayuda a aliviar la picazn, siempre que use un humectante despus de la ducha. SOLICITE ATENCIN MDICA SI:  La picazn y la erupcin no desaparecen despus de [employer].  Document Released: 04/10/2011 Document Revised: 10/21/2011 [facility] Patient Information 2015 [place], [place]. This information is not intended to replace advice given to you by your health care provider. Make sure you discuss any questions you have with your health care provider.

## 2015-04-03 NOTE — Progress Notes (Signed)
Interpreter line used Sara [name] ID [phone] Patient complains of itching all over her body Symptoms started about a week ago Denies introducing any new foods or lotions

## 2015-04-03 NOTE — Progress Notes (Signed)
Patient ID: Sara [name], female   DOB: [birth year], 35 y.o.   MRN: [id number]  CC: pruritis   HPI: Sara [name] is a 34 y.o. female here today for a follow up visit.  Patient has past medical history of hemorrhoids. Patient reports that she has been having itching all over her body for over one week. She reports that she has itching under bilateral breast, stomach, and back. She states that she noticed a rash on her left leg 3 weeks ago that is pruritc and dry. She denies rash on her trunk. She has not tried anything for itch.   Patient has No headache, No chest pain, No abdominal pain - No Nausea, No new weakness tingling or numbness, No Cough - SOB.  No Known Allergies History reviewed. No pertinent past medical history. Current Outpatient Prescriptions on File Prior to Visit  Medication Sig Dispense Refill  . Vitamin D, Ergocalciferol, (DRISDOL) 50000 UNITS CAPS capsule Take 1 capsule (50,000 Units total) by mouth every 7 (seven) days. 12 capsule 0   No current facility-administered medications on file prior to visit.   History reviewed. No pertinent family history. Social History   Social History  . Marital Status: Legally Separated    Spouse Name: N/A  . Number of Children: N/A  . Years of Education: N/A   Occupational History  . Not on file.   Social History Main Topics  . Smoking status: Never Smoker   . Smokeless tobacco: Not on file  . Alcohol Use: Yes     Comment: socially  . Drug Use: No  . Sexual Activity: Yes   Other Topics Concern  . Not on file   Social History Narrative   ** Merged History Encounter **        Review of Systems: Other than what is stated in HPI, all other systems are negative.   Objective:   Filed Vitals:   04/03/15 1005  BP: 109/66  Pulse: 57  Temp: 98 F (36.7 C)  Resp: 16    Physical Exam  Cardiovascular: Normal rate, regular rhythm and normal heart sounds.   Pulmonary/Chest: Effort normal and breath sounds  normal.  Skin: No rash noted. No erythema.  Small pruritic rash on left lower extremity     Lab Results  Component Value Date   WBC 4.5 03/16/2015   HGB 11.4* 03/16/2015   HCT 33.2* 03/16/2015   MCV 89.0 03/16/2015   PLT 193 03/16/2015   Lab Results  Component Value Date   CREATININE 0.79 03/16/2015   BUN 14 03/16/2015   NA 139 03/16/2015   K 3.5 03/16/2015   CL 105 03/16/2015   CO2 25 03/16/2015    Lab Results  Component Value Date   HGBA1C 5.4 03/16/2015   Lipid Panel     Component Value Date/Time   CHOL 111 03/04/2014 0911   TRIG 54 03/04/2014 0911   HDL 47 03/04/2014 0911   CHOLHDL 2.4 03/04/2014 0911   VLDL 11 03/04/2014 0911   LDLCALC 53 03/04/2014 0911       Assessment and plan:   Sara [name] was seen today for pruritis.  Diagnoses and all orders for this visit:  Pruritus -    Refill hydrOXYzine (ATARAX/VISTARIL) 25 MG tablet; Take 1 tablet (25 mg total) by mouth 3 (three) times daily as needed. I am unsure of etiology causing itch. She has been seen for similar complaints in the past and given atarax which helped symptoms   Dermatitis -  Refill hydrocortisone 2.5 % cream; Apply topically 2 (two) times daily. Apply to rash   Return if symptoms worsen or fail to improve.'      Ambrose Finland, NP-C [facility] [phone] 04/03/2015, 10:17 AM

## 2015-04-16 ENCOUNTER — Emergency Department (INDEPENDENT_AMBULATORY_CARE_PROVIDER_SITE_OTHER)
Admission: EM | Admit: 2015-04-16 | Discharge: 2015-04-16 | Disposition: A | Discharge status: Home / Self Care | Payer: Self-pay | Source: Home / Self Care | Attending: Family Medicine | Admitting: Family Medicine

## 2015-04-16 ENCOUNTER — Encounter (HOSPITAL_COMMUNITY): Payer: Self-pay | Admitting: Emergency Medicine

## 2015-04-16 DIAGNOSIS — L237 Allergic contact dermatitis due to plants, except food: Secondary | ICD-10-CM

## 2015-04-16 MED ORDER — METHYLPREDNISOLONE ACETATE 80 MG/ML IJ SUSP
INTRAMUSCULAR | Status: AC
Start: 2015-04-16 — End: 2015-04-16
  Filled 2015-04-16: qty 1

## 2015-04-16 MED ORDER — FLUTICASONE PROPIONATE 0.05 % EX CREA: TOPICAL_CREAM | Freq: Two times a day (BID) | CUTANEOUS | Status: DC

## 2015-04-16 MED ORDER — METHYLPREDNISOLONE ACETATE 80 MG/ML IJ SUSP
80.0000 mg | Freq: Once | INTRAMUSCULAR | Status: AC
  Administered 2015-04-16: 80 mg via INTRAMUSCULAR

## 2015-04-16 NOTE — ED Provider Notes (Signed)
CSN: [id number]     Arrival date & time 04/16/15  1320 History   First MD Initiated Contact with Patient 04/16/15 1328     Chief Complaint  Patient presents with  . Poison Ivy   (Consider location/radiation/quality/duration/timing/severity/associated sxs/prior Treatment) Patient is a 34 y.o. female presenting with poison ivy. The history is provided by the patient.  Poison Lajoyce Corners This is a new problem. The current episode started more than 1 week ago (onset after working in yard pulling weeds.). The problem has been gradually worsening. Pertinent negatives include no chest pain, no abdominal pain, no headaches and no shortness of breath.    History reviewed. No pertinent past medical history. History reviewed. No pertinent past surgical history. No family history on file. Social History  Substance Use Topics  . Smoking status: Never Smoker   . Smokeless tobacco: None  . Alcohol Use: Yes     Comment: socially   OB History    No data available     Review of Systems  Constitutional: Negative.   Respiratory: Negative for shortness of breath.   Cardiovascular: Negative for chest pain.  Gastrointestinal: Negative for abdominal pain.  Musculoskeletal: Negative.   Skin: Positive for rash.  Neurological: Negative for headaches.  All other systems reviewed and are negative.   Allergies  Review of patient's allergies indicates no known allergies.  Home Medications   Prior to Admission medications   Medication Sig Start Date End Date Taking? Authorizing Provider  fluticasone (CUTIVATE) 0.05 % cream Apply topically 2 (two) times daily. 04/16/15   Linna Hoff, MD  hydrocortisone 2.5 % cream Apply topically 2 (two) times daily. Apply to rash 04/03/15   Ambrose Finland, NP  hydrOXYzine (ATARAX/VISTARIL) 25 MG tablet Take 1 tablet (25 mg total) by mouth 3 (three) times daily as needed. 04/03/15   Ambrose Finland, NP  Vitamin D, Ergocalciferol, (DRISDOL) 50000 UNITS CAPS capsule Take 1 capsule  (50,000 Units total) by mouth every 7 (seven) days. 03/17/15   Doris Cheadle, MD   Meds Ordered and Administered this Visit   Medications  methylPREDNISolone acetate (DEPO-MEDROL) injection 80 mg (80 mg Intramuscular Given 04/16/15 1358)    BP 114/73 mmHg  Pulse 57  Temp(Src) 99.2 F (37.3 C) (Oral)  Resp 16  SpO2 99%  LMP 04/08/2015 No data found.   Physical Exam  Constitutional: She is oriented to person, place, and time. She appears well-developed and well-nourished.  Musculoskeletal: Normal range of motion. She exhibits no tenderness.  Neurological: She is alert and oriented to person, place, and time.  Skin: Skin is warm and dry. Rash noted. No erythema.  Patchy papulovesicular irreg dermatitis on both upper ext only.  Nursing note and vitals reviewed.   ED Course  Procedures (including critical care time)  Labs Review Labs Reviewed - No data to display  Imaging Review No results found.   Visual Acuity Review  Right Eye Distance:   Left Eye Distance:   Bilateral Distance:    Right Eye Near:   Left Eye Near:    Bilateral Near:         MDM    rx for cutivate given    Linna Hoff, MD 04/16/15 2042

## 2015-04-16 NOTE — ED Notes (Signed)
C/o rash on bilateral arms and abd onset 1 week Reports she was doing yard work at home and possibly came in contact w/poison ivy Denies fevers, chills Alert... No acute distress.

## 2015-04-21 ENCOUNTER — Ambulatory Visit: Payer: Self-pay | Admitting: Internal Medicine

## 2016-01-04 ENCOUNTER — Ambulatory Visit: Payer: Self-pay | Attending: Internal Medicine

## 2016-02-05 ENCOUNTER — Encounter: Payer: Self-pay | Admitting: Internal Medicine

## 2016-02-05 ENCOUNTER — Ambulatory Visit: Payer: Self-pay | Attending: Internal Medicine | Admitting: Internal Medicine

## 2016-02-05 VITALS — BP 124/77 | HR 60 | Temp 98.1°F | Resp 18 | Ht 66.0 in | Wt 187.4 lb

## 2016-02-05 DIAGNOSIS — Z0000 Encounter for general adult medical examination without abnormal findings: Secondary | ICD-10-CM

## 2016-02-05 DIAGNOSIS — D649 Anemia, unspecified: Secondary | ICD-10-CM

## 2016-02-05 DIAGNOSIS — R300 Dysuria: Secondary | ICD-10-CM

## 2016-02-05 DIAGNOSIS — Z124 Encounter for screening for malignant neoplasm of cervix: Secondary | ICD-10-CM

## 2016-02-05 LAB — POCT URINALYSIS DIPSTICK
Bilirubin, UA: NEGATIVE
Blood, UA: NEGATIVE
Glucose, UA: NEGATIVE
KETONES UA: NEGATIVE
Leukocytes, UA: NEGATIVE
Nitrite, UA: NEGATIVE
PROTEIN UA: NEGATIVE
SPEC GRAV UA: 1.02
Urobilinogen, UA: 0.2
pH, UA: 6.5

## 2016-02-05 NOTE — Progress Notes (Signed)
Sara [name], is a 35 y.o. female  ZOX:[id number]  [id number]  DOB - 08/29/1980  CC:  Chief Complaint  Patient presents with  . Annual Exam       HPI: Sara [name] is a 35 y.o. female here today to establish medical care., last seen by NP 8/16., doing well, no c/o today.  Eating well. Doesn't smoke or drink etoh. She has 4 children, currently not sexually active.  Last papsmear 3 years ago.  Pt at end of her menses cycle now, had some pelvic tenderness prior to menses, ?+/- dysuria. Better now.  Patient has No headache, No chest pain, No abdominal pain - No Nausea, No new weakness tingling or numbness, No Cough - SOB.  Interpreter was used to communicate directly with patient for the entire encounter including providing detailed patient instructions.   Review of Systems: Per HPI, o/w all systems reviewed and negative.   No Known Allergies History reviewed. No pertinent past medical history. Current Outpatient Prescriptions on File Prior to Visit  Medication Sig Dispense Refill  . fluticasone (CUTIVATE) 0.05 % cream Apply topically 2 (two) times daily. 30 g 1  . hydrocortisone 2.5 % cream Apply topically 2 (two) times daily. Apply to rash 28 g 0  . hydrOXYzine (ATARAX/VISTARIL) 25 MG tablet Take 1 tablet (25 mg total) by mouth 3 (three) times daily as needed. 60 tablet 1  . Vitamin D, Ergocalciferol, (DRISDOL) 50000 UNITS CAPS capsule Take 1 capsule (50,000 Units total) by mouth every 7 (seven) days. 12 capsule 0   No current facility-administered medications on file prior to visit.   History reviewed. No pertinent family history. Social History   Social History  . Marital Status: Legally Separated    Spouse Name: N/A  . Number of Children: N/A  . Years of Education: N/A   Occupational History  . Not on file.   Social History Main Topics  . Smoking status: Never Smoker   . Smokeless tobacco: Not on file  . Alcohol Use: Yes     Comment: socially    . Drug Use: No  . Sexual Activity: Yes   Other Topics Concern  . Not on file   Social History Narrative   ** Merged History Encounter **        Objective:   Filed Vitals:   02/05/16 1620  BP: 124/77  Pulse: 60  Temp: 98.1 F (36.7 C)  Resp: 18    Filed Weights   02/05/16 1620  Weight: 187 lb 6.4 oz (85.004 kg)    BP Readings from Last 3 Encounters:  02/05/16 124/77  04/16/15 114/73  04/03/15 109/66   Papsmear performed w/ CMA assistance  Physical Exam: Constitutional: Patient appears well-developed and well-nourished. No distress. AAOx3, obese, pleasant HENT: Normocephalic, atraumatic, External right and left ear normal. Oropharynx is clear and moist. bilat TMs clear, good light reflex. Tongue ring. Eyes: Conjunctivae and EOM are normal. PERRL, no scleral icterus. CVS: RRR, S1/S2 +, no murmurs, no gallops, no carotid bruit.   No JVD. Breast exam/axilla: bilateral exam unremarkable, no palpable masses/lesions/nipple discharge. Pulmonary: Effort and breath sounds normal, no stridor, rhonchi, wheezes, rales.  Abdominal: Soft. BS +, no distension, tenderness, rebound or guarding.  Pelvic Exam: Cervix normal in appearance, external genitalia normal, no adnexal masses or tenderness, no cervical motion tenderness, rectovaginal septum normal, uterus normal size, shape, and consistency and vagina normal without discharge  Shaved groin.  Musculoskeletal: Normal range of motion. No edema and no tenderness.  LE: bilat/ no c/c/e, pulses 2+ bilateral. Lymphadenopathy: No lymphadenopathy noted, cervical, inguinal or axillary Neuro: Alert.  muscle tone coordination wnl. No cranial nerve deficit grossly. Skin: Skin is warm and dry. No rash noted. Not diaphoretic. No erythema. No pallor.  Numerous tattoos on lower back and right shoulder. Psychiatric: Normal mood and affect. Behavior, judgment, thought content normal.  Lab Results  Component Value Date   WBC 4.5 03/16/2015   HGB  11.4* 03/16/2015   HCT 33.2* 03/16/2015   MCV 89.0 03/16/2015   PLT 193 03/16/2015   Lab Results  Component Value Date   CREATININE 0.79 03/16/2015   BUN 14 03/16/2015   NA 139 03/16/2015   K 3.5 03/16/2015   CL 105 03/16/2015   CO2 25 03/16/2015    Lab Results  Component Value Date   HGBA1C 5.4 03/16/2015   Lipid Panel     Component Value Date/Time   CHOL 111 03/04/2014 0911   TRIG 54 03/04/2014 0911   HDL 47 03/04/2014 0911   CHOLHDL 2.4 03/04/2014 0911   VLDL 11 03/04/2014 0911   LDLCALC 53 03/04/2014 0911       Depression screen PHQ 2/9 02/05/2016 03/24/2015  Decreased Interest 0 0  Down, Depressed, Hopeless 0 0  PHQ - 2 Score 0 0    Assessment and plan:   1. Normocytic anemia - CBC with Differential - Iron, TIBC and Ferritin Panel - still having menses, but normal caliber for her., may need iron supplements, will follow.  2. Pap smear for cervical cancer screening, currently at end of menses. - Cytology - PAP - currently not sexually active, no concerns for STD risk.  3. Annual physical exam - BASIC METABOLIC PANEL WITH GFR - TSH - aic 8/16 5.4  4. Dysuria - Urinalysis Dipstick - neg.   Return in about 6 months (around 08/06/2016).  The patient was given clear instructions to go to ER or return to medical center if symptoms don't improve, worsen or new problems develop. The patient verbalized understanding. The patient was told to call to get lab results if they haven't heard anything in the next week.    This note has been created with Education officer, environmentalDragon speech recognition software and smart phrase technology. Any transcriptional errors are unintentional.   Pete Glatterawn T Devery Odwyer, MD, MBA/MHA [facility] [place], [place] [phone]   02/05/2016, 4:22 PM

## 2016-02-05 NOTE — Progress Notes (Signed)
Patient is here for PAP  Patient complains of itching

## 2016-02-05 NOTE — Patient Instructions (Signed)
Plan de alimentacin cardiosaludable (Heart-Healthy Eating Plan) La planificacin de las comidas cardiosaludables incluye lo siguiente:  Limitar las grasas poco saludables.  Aumentar las grasas saludables.  Hacer otros pequeos cambios en la dieta. Es posible que tenga que hablar con el mdico o con un especialista en alimentacin (nutricionista) para crear un plan de alimentacin que sea adecuado para usted. QU TIPOS DE GRASAS DEBO ELEGIR?  Elija las grasas saludables. Estas incluyen aceite de oliva y de canola, semillas de lino, nueces, almendras y semillas.  Consuma ms grasas omega-3. Estas incluyen salmn, caballa, sardinas, atn, aceite de lino y semillas de lino molidas. Trate de comer pescado al Borders Groupmenos dos veces por semana.  Limite el consumo de grasas saturadas,  las cuales suelen Circuit Cityencontrarse en los productos de origen animal, como carnes, [place] Stevensmantequilla y crema.  Las grasas saturadas de origen vegetal incluyen aceite de palma, de palmiste y de coco.  Evite los alimentos con aceites parcialmente hidrogenados. Estos incluyen margarina en barra, algunas margarinas untables, galletas, galletitas y otros productos horneados. Estos contienen grasas trans. QU PAUTAS GENERALES DEBO SEGUIR?  Lea atentamente las etiquetas de los alimentos. Identifique los que contienen grasas trans o altas cantidades de grasas saturadas.  Llene la mitad del plato con verduras y ensaladas de hojas verdes. Coma 4 o 5porciones de verduras Air cabin crewpor da. Una porcin de verduras equivale a lo siguiente:  1 taza de verduras de hoja crudas.   taza de verduras en trozos crudas o cocidas.   taza de jugo de verduras.  Llene un cuarto del plato con cereales integrales. Busque la palabra "integral" en Estate agentel primer lugar de la lista de ingredientes.  Llene un cuarto del plato con alimentos con protenas magras.  Coma 4 o 5porciones de frutas por da. Una porcin de fruta equivale a lo siguiente:  Una fruta  mediana entera.  taza de fruta disecada.   taza de fruta fresca, congelada o enlatada.  taza de jugo 100% de fruta.  Consuma ms alimentos con fibra soluble, los cuales incluyen Chain of Rocksmanzanas, brcoli, zanahorias, frijoles, guisantes y Qatarcebada. Trate de consumir de 20a 30g de The Northwestern Mutualfibra por da.  Coma ms comidas caseras. Coma menos comida de restaurantes, bufs y comida rpida.  Limite o evite el alcohol.  Limite los alimentos con alto contenido de almidn y International aid/development workerazcar.  Evite las comidas fritas.  Evite frer los alimentos. En cambio, trate de cocinarlos en el horno, en la plancha o en la parrilla, o hervirlos. Tambin puede reducir las grasas de la siguiente forma:  Quite la piel de las aves.  Quite todas las grasas visibles de las carnes.  Espume la grasa de los guisos, las sopas y las salsas antes de servirlos.  Cocine al vapor las verduras en agua o caldo.  Baje de peso si es necesario.  Coma 4 o 5porciones de frutos secos, legumbres y semillas por semana:  Una porcin de frijoles o legumbres secos equivale a taza despus de su coccin.  Una porcin de frutos secos equivale a 1onzas.  Una porcin de semillas equivale a onza o una cucharada.  Tal vez deba llevar un registro de la cantidad de sal o sodio que ingiere, especialmente si tiene la presin hipertensin arterial. Hable con el mdico o el nutricionista para obtener ms informacin. QU ALIMENTOS PUEDO COMER? Cereales Panes, incluido el pan francs, blanco, pita, de [place] Paloma-Lost Creektrigo, de pasas de Woodburyuva, de centeno, de avena e Kauneong[place] Lakeitaliano. Tortillas que no estn fritas ni elaboradas con manteca de cerdo ni grasas trans.  Panecillos bajos en grasas, incluidos los panes para perros calientes y [place], y los bollitos tipo ingls. Galletas. Muffins. Waffles. Panqueques. Palomitas de maz con bajo contenido calrico. Cereales integrales. Pan sin levadura. Tostada Melba. Pretzels. Palitos de pan. Galletas. Colaciones bajas en  grasas. Galletitas Henry Schein, Avery Dennison, las que tienen forma de [place], las [place], el pan cimo, las [place], las que tienen forma de [place] y las de centeno. Arroz y pastas, incluido el arroz integral y las pastas elaboradas con cereales integrales.  Verduras Todas las verduras.  Frutas Todas las frutas, pero limite el [place]. [place] y [place] fuentes de protenas Carne de res, [place], cerdo y cordero magras sin grasa. Pollo y pavo sin piel. Todos los pescados y Liberty Global. Pato salvaje, conejo, faisn y venado. Claras de huevo o sustitutos del huevo bajos en colesterol. Porotos, guisantes, lentejas secos y tofu. Semillas y la mayora de los frutos secos. Lcteos Quesos descremados y semidescremados, entre ellos, ricota, queso en hebras y Garment/textile technologist. Leche descremada o al 1% que sea lquida, en polvo o evaporada. Suero de [employer] Resources elaborado con Molson Coors Brewing. Yogur descremado o bajo en grasas. Bebidas Agua mineral. Bebidas gaseosas dietticas. Dulces y postres Sorbetes y helados de fruta. [place], [place], [place], jalea y [place]. Merengues y gelatinas. Caramelos de Science Applications International, como caramelos duros, caramelos de goma, pastillas de goma, mentas, malvaviscos y pequeas cantidades de chocolate amargo. Torta ngel. Coma todos los dulces y postres con moderacin. Grasas y Writer no hidrogenadas (sin grasas trans). Aceites vegetales, incluido el de soja, ssamo, girasol, [place], man, crtamo, maz, canola y semillas de algodn. Alios para ensalada o mayonesa elaborados con aceite vegetal. Limite las grasas y los aceites agregados que [place] para Water quality scientist, Development worker, community, preparar ensaladas y las cremas untables. Otros [place] en polvo. T o caf. Todos los alios y condimentos. Los artculos mencionados arriba pueden no ser Raytheon de las bebidas o los alimentos recomendados. Comunquese con el nutricionista para conocer ms opciones. QU ALIMENTOS NO SE  RECOMIENDAN? Cereales Panes elaborados con grasas saturadas o trans, aceites o Eastman Kodak. Croissants. Panecillos de mantequilla. Panes de queso. Panecillos dulces. Rosquillas. Palomitas de maz con mantequilla. Fideos chow mein. Galletitas con FedEx de grasas, como las que contienen queso o [place]. Carnes y [address] fuentes de protenas Carnes grasas, como perros calientes, [place] de res, [address], puntas de [place], [place], asado de [place] o [place], y carnero. Fiambres altos en grasas, como salame y [place]. Caviar. Pato y ganso domsticos. Vsceras, como riones, hgado, [place], y Programmer, multimedia. Lcteos Crema, crema agria, queso crema y [place] cottage con crema. Quesos elaborados con Eastman Kodak, incluido el queso [place] (bleu), [place], [place], [place], [place], [place], suizo, cheddar, camembert y [place]. Leche entera o al 2% que sea [place], evaporada o condensada. Suero de Liberty Global. Salsa de crema o queso alta en grasas. Yogur elaborado con Eastman Kodak. Bebidas Refrescos regulares y jugos con agregado de International aid/development worker. Dulces y Hughes Supply. Pudin. Galletas. Tortas que no sean la torta ngel. Caramelos que contengan chocolate con leche o chocolate blanco, grasa hidrogenada, mantequilla, coco o ingredientes desconocidos. Almbares con mantequilla. Helados o bebidas elaboradas con helado con alto contenido de grasas. Grasas y [address] que [place], [place] (the territory [place] of 60 deg S) de carne o materia grasa. Manteca de cacao, aceites hidrogenados, aceite de palma, aceite de coco, aceite de palmiste. A menudo, estos se encuentran en los productos horneados, los caramelos, las comidas fritas, las cremas no lcteas y las coberturas batidas. Grasas y [place]  grasas slidas, incluida la grasa del tocino, el cerdo Altamontsalado, la West Salemmanteca de cerdo y Civil engineer, contractingla mantequilla. Sustitutos de crema no lctea, como cremas para caf y sustitutos de crema agria. Alios para ensaladas elaborados con aceites  desconocidos, queso o crema agria. Los artculos mencionados arriba pueden no ser Raytheonuna lista completa de las bebidas y los alimentos que se Theatre stage managerdeben evitar. Comunquese con el nutricionista para obtener ms informacin.   Esta informacin no tiene Theme park managercomo fin reemplazar el consejo del mdico. Asegrese de hacerle al mdico cualquier pregunta que tenga.   Document Released: 01/28/2012 Document Revised: 08/19/2014 Elsevier Interactive Patient Education Yahoo[employer]2016 Elsevier Inc.

## 2016-02-06 LAB — BASIC METABOLIC PANEL WITH GFR
BUN: 14 mg/dL (ref 7–25)
CHLORIDE: 108 mmol/L (ref 98–110)
CO2: 24 mmol/L (ref 20–31)
Calcium: 8.9 mg/dL (ref 8.6–10.2)
Creat: 0.65 mg/dL (ref 0.50–1.10)
GFR, Est African American: >89 mL/min (ref >=60)
Glucose, Bld: 83 mg/dL (ref 65–99)
Potassium: 3.9 mmol/L (ref 3.5–5.3)
SODIUM: 139 mmol/L (ref 135–146)

## 2016-02-06 LAB — CBC WITH DIFFERENTIAL/PLATELET
BASOS PCT: 1 %
Basophils Absolute: 52 cells/uL (ref 0–200)
EOS ABS: 104 {cells}/uL (ref 15–500)
Eosinophils Relative: 2 %
HEMATOCRIT: 34.6 % — AB (ref 35.0–45.0)
HEMOGLOBIN: 11.7 g/dL (ref 11.7–15.5)
Lymphocytes Relative: 35 %
Lymphs Abs: 1820 cells/uL (ref 850–3900)
MCH: 29.8 pg (ref 27.0–33.0)
MCHC: 33.8 g/dL (ref 32.0–36.0)
MCV: 88.3 fL (ref 80.0–100.0)
MONO ABS: 364 {cells}/uL (ref 200–950)
MPV: 10.9 fL (ref 7.5–12.5)
Monocytes Relative: 7 %
NEUTROS ABS: 2860 {cells}/uL (ref 1500–7800)
Neutrophils Relative %: 55 %
Platelets: 243 10*3/uL (ref 140–400)
RBC: 3.92 MIL/uL (ref 3.80–5.10)
RDW: 13.8 % (ref 11.0–15.0)
WBC: 5.2 10*3/uL (ref 3.8–10.8)

## 2016-02-06 LAB — IRON,TIBC AND FERRITIN PANEL
%SAT: 17 % (ref 11–50)
Ferritin: 29 ng/mL (ref 10–154)
Iron: 54 ug/dL (ref 40–190)
TIBC: 315 ug/dL (ref 250–450)

## 2016-02-06 LAB — TSH: TSH: 2.29 mIU/L

## 2016-02-07 LAB — CYTOLOGY - PAP

## 2016-02-08 LAB — CERVICOVAGINAL ANCILLARY ONLY
BACTERIAL VAGINITIS: NEGATIVE
CANDIDA VAGINITIS: NEGATIVE

## 2017-01-05 ENCOUNTER — Encounter (HOSPITAL_COMMUNITY): Payer: Self-pay | Admitting: Emergency Medicine

## 2017-01-05 ENCOUNTER — Ambulatory Visit (HOSPITAL_COMMUNITY)
Admission: EM | Admit: 2017-01-05 | Discharge: 2017-01-05 | Disposition: A | Discharge status: Home / Self Care | Payer: Self-pay | Attending: Family Medicine | Admitting: Family Medicine

## 2017-01-05 DIAGNOSIS — L237 Allergic contact dermatitis due to plants, except food: Secondary | ICD-10-CM

## 2017-01-05 MED ORDER — PREDNISONE 20 MG PO TABS: ORAL_TABLET | ORAL | 0 refills | Status: DC

## 2017-01-05 NOTE — ED Provider Notes (Signed)
[facility]    CSN: [id number] Arrival date & time: 01/05/17  1643     History   Chief Complaint Chief Complaint  Patient presents with  . Rash    HPI Sara [name] is a 36 y.o. female.   The patient presented to the [facility] with a complaint of a rash x3 days that she believed to be poison ivy.  Rash involves the forehead, neck, hands, and abdomen. It's quite pruritic.      History reviewed. No pertinent past medical history.  Patient Active Problem List   Diagnosis Date Noted  . Normocytic anemia 02/05/2016  . Abdominal pain, right upper quadrant 02/16/2014  . Other hemorrhoids 02/16/2014  . TINEA PEDIS 08/31/2009  . PLANTAR FASCIITIS, LEFT 07/18/2009  . SKIN RASH 07/18/2009    History reviewed. No pertinent surgical history.  OB History    No data available       Home Medications    Prior to Admission medications   Medication Sig Start Date End Date Taking? Authorizing Provider  predniSONE (DELTASONE) 20 MG tablet Two daily with food 01/05/17   Elvina Sidle, MD    Family History History reviewed. No pertinent family history.  Social History Social History  Substance Use Topics  . Smoking status: Never Smoker  . Smokeless tobacco: Never Used  . Alcohol use Yes     Comment: socially     Allergies   Patient has no known allergies.   Review of Systems Review of Systems  Skin: Positive for rash.  All other systems reviewed and are negative.    Physical Exam Triage Vital Signs ED Triage Vitals  Enc Vitals Group     BP 01/05/17 1705 (!) 109/58     Pulse Rate 01/05/17 1705 (!) 58     Resp 01/05/17 1705 18     Temp 01/05/17 1705 98 F (36.7 C)     Temp Source 01/05/17 1705 Oral     SpO2 01/05/17 1705 100 %     Weight --      Height --      Head Circumference --      Peak Flow --      Pain Score 01/05/17 1703 7     Pain Loc --      Pain Edu? --      Excl. in GC? --    No data found.   Updated Vital Signs BP  (!) 109/58 (BP Location: Right Arm)   Pulse (!) 58   Temp 98 F (36.7 C) (Oral)   Resp 18   SpO2 100%    Physical Exam  Constitutional: She is oriented to person, place, and time. She appears well-developed and well-nourished.  HENT:  Right Ear: External ear normal.  Left Ear: External ear normal.  Mouth/Throat: Oropharynx is clear and moist.  Eyes: Conjunctivae and EOM are normal. Pupils are equal, round, and reactive to light.  Neck: Normal range of motion. Neck supple.  Pulmonary/Chest: Effort normal.  Musculoskeletal: Normal range of motion.  Neurological: She is alert and oriented to person, place, and time.  Skin: Skin is warm. There is erythema.  Linear vesicular eruption on face, neck, arms, and abdomen.  Nursing note and vitals reviewed.    UC Treatments / Results  Labs (all labs ordered are listed, but only abnormal results are displayed) Labs Reviewed - No data to display  EKG  EKG Interpretation None       Radiology No results found.  Procedures  Procedures (including critical care time)  Medications Ordered in UC Medications - No data to display   Initial Impression / Assessment and Plan / UC Course  I have reviewed the triage vital signs and the nursing notes.  Pertinent labs & imaging results that were available during my care of the patient were reviewed by me and considered in my medical decision making (see chart for details).     Final Clinical Impressions(s) / UC Diagnoses   Final diagnoses:  Poison ivy dermatitis    New Prescriptions New Prescriptions   PREDNISONE (DELTASONE) 20 MG TABLET    Two daily with food     Elvina SidleLauenstein, Audelia Knape, MD 01/05/17 1725

## 2017-01-05 NOTE — ED Triage Notes (Signed)
[facility] a [facility] of a rash x3 days that she believed to be poison ivy.

## 2019-03-05 ENCOUNTER — Ambulatory Visit (HOSPITAL_COMMUNITY)
Admission: EM | Admit: 2019-03-05 | Discharge: 2019-03-05 | Disposition: A | Discharge status: Home / Self Care | Payer: Self-pay | Attending: Family Medicine | Admitting: Family Medicine

## 2019-03-05 ENCOUNTER — Other Ambulatory Visit: Payer: Self-pay

## 2019-03-05 ENCOUNTER — Encounter (HOSPITAL_COMMUNITY): Payer: Self-pay | Admitting: Emergency Medicine

## 2019-03-05 DIAGNOSIS — R1011 Right upper quadrant pain: Secondary | ICD-10-CM

## 2019-03-05 MED ORDER — TRAMADOL HCL 50 MG PO TABS: 50.0000 mg | ORAL_TABLET | Freq: Four times a day (QID) | ORAL | 0 refills | Status: DC | PRN

## 2019-03-05 NOTE — Discharge Instructions (Signed)
Your pain is in the location of your liver and gallbladder You can help this pain by reducing the fat in your diet.  I am enclosing information for you You can take Tylenol for pain.  If it is not strong enough that I am giving you tramadol to take.  This can cause drowsiness. Need to follow-up with your primary care doctor to get additional test scheduling.  Abdominal ultrasound, like you had several years ago, should be repeated

## 2019-03-05 NOTE — ED Triage Notes (Signed)
Pt c/o throbbing pain in her RUQ of her abdomen, nontender to palpation. Denies n/v/d.

## 2019-03-05 NOTE — ED Provider Notes (Signed)
[facility]    CSN: [id number] Arrival date & time: 03/05/19  1639      History   Chief Complaint Chief Complaint  Patient presents with  . Abdominal Pain    HPI Sara [name] is a 38 y.o. female.   HPI  Patient is seen with the assistance of a Spanish interpreter She is here for throbbing pain in the right upper quadrant.  Constant.  Present for several days.  Not associated with meals.  No nausea or vomiting.  No change in appetite.  No change in bowels.  No dysuria or frequency.  No fever chills.  No change in medications.  No one else in the house is ill.  The pain is moderate at times and then comes in waves of more severe pain. She had right upper quadrant pain several years ago and an ultrasound in 2015 that was normal. She does not have any acid reflux or epigastric pain.  No complaint of belching or gas  History reviewed. No pertinent past medical history.  Patient Active Problem List   Diagnosis Date Noted  . Normocytic anemia 02/05/2016  . Abdominal pain, right upper quadrant 02/16/2014  . Other hemorrhoids 02/16/2014  . TINEA PEDIS 08/31/2009  . PLANTAR FASCIITIS, LEFT 07/18/2009  . SKIN RASH 07/18/2009    History reviewed. No pertinent surgical history.  OB History   No obstetric history on file.      Home Medications    Prior to Admission medications   Medication Sig Start Date End Date Taking? Authorizing Provider  traMADol (ULTRAM) 50 MG tablet Take 1 tablet (50 mg total) by mouth every 6 (six) hours as needed. 03/05/19   Eustace MooreNelson, Darin Arndt Sue, MD    Family History No family history on file.  Social History Social History   Tobacco Use  . Smoking status: Never Smoker  . Smokeless tobacco: Never Used  Substance Use Topics  . Alcohol use: Yes    Comment: socially  . Drug use: No     Allergies   Patient has no known allergies.   Review of Systems Review of Systems  Constitutional: Negative for chills and fever.   HENT: Negative for ear pain and sore throat.   Eyes: Negative for pain and visual disturbance.  Respiratory: Negative for cough and shortness of breath.   Cardiovascular: Negative for chest pain and palpitations.  Gastrointestinal: Positive for abdominal pain and nausea. Negative for blood in stool, diarrhea and vomiting.  Genitourinary: Negative for dysuria and hematuria.  Musculoskeletal: Negative for arthralgias and back pain.  Skin: Negative for color change and rash.  Neurological: Negative for seizures and syncope.  All other systems reviewed and are negative.    Physical Exam Triage Vital Signs ED Triage Vitals  Enc Vitals Group     BP 03/05/19 1719 113/79     Pulse Rate 03/05/19 1719 65     Resp 03/05/19 1719 16     Temp 03/05/19 1719 98.5 F (36.9 C)     Temp src --      SpO2 03/05/19 1719 100 %     Weight --      Height --      Head Circumference --      Peak Flow --      Pain Score 03/05/19 1718 6     Pain Loc --      Pain Edu? --      Excl. in GC? --    No data found.  Updated Vital Signs BP 113/79   Pulse 65   Temp 98.5 F (36.9 C)   Resp 16   LMP 02/09/2019   SpO2 100%       Physical Exam Constitutional:      General: She is not in acute distress.    Appearance: She is well-developed.  HENT:     Head: Normocephalic and atraumatic.     Mouth/Throat:     Mouth: Mucous membranes are moist.  Eyes:     Extraocular Movements: Extraocular movements intact.     Conjunctiva/sclera: Conjunctivae normal.     Pupils: Pupils are equal, round, and reactive to light.  Neck:     Musculoskeletal: Normal range of motion.  Cardiovascular:     Rate and Rhythm: Normal rate and regular rhythm.     Heart sounds: Normal heart sounds.  Pulmonary:     Effort: Pulmonary effort is normal. No respiratory distress.     Breath sounds: Normal breath sounds.  Abdominal:     General: Abdomen is protuberant. Bowel sounds are normal. There is no distension.      Palpations: Abdomen is soft.     Tenderness: There is abdominal tenderness in the right upper quadrant. There is no guarding or rebound.     Comments: Moderate tenderness to deep palpation in the right upper quadrant.  No palpable hepatomegaly or mass.  No guarding or rebound.  Musculoskeletal: Normal range of motion.  Skin:    General: Skin is warm and dry.  Neurological:     Mental Status: She is alert.      UC Treatments / Results  Labs (all labs ordered are listed, but only abnormal results are displayed) Labs Reviewed - No data to display  EKG   Radiology No results found.  Procedures Procedures (including critical care time)  Medications Ordered in UC Medications - No data to display  Initial Impression / Assessment and Plan / UC Course  I have reviewed the triage vital signs and the nursing notes.  Pertinent labs & imaging results that were available during my care of the patient were reviewed by me and considered in my medical decision making (see chart for details).     Possible gallbladder disease. Final Clinical Impressions(s) / UC Diagnoses   Final diagnoses:  Continuous RUQ abdominal pain     Discharge Instructions     Your pain is in the location of your liver and gallbladder You can help this pain by reducing the fat in your diet.  I am enclosing information for you You can take Tylenol for pain.  If it is not strong enough that I am giving you tramadol to take.  This can cause drowsiness. Need to follow-up with your primary care doctor to get additional test scheduling.  Abdominal ultrasound, like you had several years ago, should be repeated   ED Prescriptions    Medication Sig Dispense Auth. Provider   traMADol (ULTRAM) 50 MG tablet Take 1 tablet (50 mg total) by mouth every 6 (six) hours as needed. 15 tablet Raylene Everts, MD     Controlled Substance Prescriptions [place] Controlled Substance Registry consulted? Not Applicable   Raylene Everts, MD 03/05/19 [phone]

## 2019-04-09 ENCOUNTER — Ambulatory Visit: Payer: Self-pay | Attending: Nurse Practitioner | Admitting: Nurse Practitioner

## 2019-04-09 ENCOUNTER — Encounter: Payer: Self-pay | Admitting: Nurse Practitioner

## 2019-04-09 VITALS — Ht 66.0 in

## 2019-04-09 DIAGNOSIS — Z7689 Persons encountering health services in other specified circumstances: Secondary | ICD-10-CM

## 2019-04-09 DIAGNOSIS — K219 Gastro-esophageal reflux disease without esophagitis: Secondary | ICD-10-CM

## 2019-04-09 DIAGNOSIS — R1011 Right upper quadrant pain: Secondary | ICD-10-CM

## 2019-04-09 NOTE — Progress Notes (Signed)
Virtual Visit via Telephone Note Due to national recommendations of social distancing due to [place], telehealth visit is felt to be most appropriate for this patient at this time.  I discussed the limitations, risks, security and privacy concerns of performing an evaluation and management service by telephone and the availability of in person appointments. I also discussed with the patient that there may be a patient responsible charge related to this service. The patient expressed understanding and agreed to proceed.    I connected with Sara [name] on 04/09/19  at   1:50 PM EDT  EDT by telephone and verified that I am speaking with the correct person using two identifiers.   Consent I discussed the limitations, risks, security and privacy concerns of performing an evaluation and management service by telephone and the availability of in person appointments. I also discussed with the patient that there may be a patient responsible charge related to this service. The patient expressed understanding and agreed to proceed.   Location of Patient: Private Residence   Location of Provider: [place] and [place] Office    Persons participating in Telemedicine visit: Geryl Rankins FNP-BC [place] # [phone]   History of Present Illness: Telemedicine visit for: Establish Care  She has not been under the care of a primary provider in 3-4 years.  Has a past medical history of GERD (gastroesophageal reflux disease). She is overdue for a PAP SMEAR   She has a history of chronic abdominal pain with reoccurence almost 2 months ago. Prior to 2 months ago there is also a past history of RUQ abdominal pain and GERD.  She was recently evaluated in the ED on 03-05-2019 for continuous RUQ abdominal pain.  It was felt that there could be gallbladder disease however no labs or imaging were obtained. She was prescribed tramadol and  instructed to follow up with a PCP.   Today she continues to complain of a throbbing pain in the right upper quadrant. Not associated with meals.  No nausea or vomiting.  No change in appetite.  No change in bowels.  No dysuria or frequency.  No fever chills.  No change in medications.  No one else in the house is ill.  The pain is moderate at times and then comes in waves of more severe pain. RUQ ultrasound in 2015 was normal. She denies any  acid reflux or epigastric pain however she does have a previous history of GERD but is not currently taking any acid reducing medications. .     Past Medical History:  Diagnosis Date  . GERD (gastroesophageal reflux disease)     Past Surgical History:  Procedure Laterality Date  . CESAREAN SECTION     3x    Family History  Problem Relation Age of Onset  . Diabetes Neg Hx   . Hypertension Neg Hx     Social History   Socioeconomic History  . Marital status: Legally Separated    Spouse name: Not on file  . Number of children: Not on file  . Years of education: Not on file  . Highest education level: Not on file  Occupational History  . Not on file  Social Needs  . Financial resource strain: Not on file  . Food insecurity    Worry: Not on file    Inability: Not on file  . Transportation needs    Medical: Not on file    Non-medical: Not on file  Tobacco Use  . Smoking status: Never Smoker  . Smokeless tobacco: Never Used  Substance and Sexual Activity  . Alcohol use: Yes    Comment: socially  . Drug use: No  . Sexual activity: Yes  Lifestyle  . Physical activity    Days per week: Not on file    Minutes per session: Not on file  . Stress: Not on file  Relationships  . Social Herbalist on phone: Not on file    Gets together: Not on file    Attends religious service: Not on file    Active member of club or organization: Not on file    Attends meetings of clubs or organizations: Not on file    Relationship status:  Not on file  Other Topics Concern  . Not on file  Social History Narrative   ** Merged History Encounter **         Observations/Objective: Awake, alert and oriented x 3   Review of Systems  Constitutional: Negative for fever, malaise/fatigue and weight loss.  HENT: Negative.  Negative for nosebleeds.   Eyes: Negative.  Negative for blurred vision, double vision and photophobia.  Respiratory: Negative.  Negative for cough and shortness of breath.   Cardiovascular: Negative.  Negative for chest pain, palpitations and leg swelling.  Gastrointestinal: Positive for abdominal pain and heartburn. Negative for blood in stool, constipation, diarrhea, melena, nausea and vomiting.  Musculoskeletal: Negative.  Negative for myalgias.  Neurological: Negative.  Negative for dizziness, focal weakness, seizures and headaches.  Psychiatric/Behavioral: Negative.  Negative for suicidal ideas.    Assessment and Plan: Sara [name] was seen today for establish care.  Diagnoses and all orders for this visit:  Encounter to establish care -     Hemoglobin A1c; Future  Abdominal pain, right upper quadrant -     omeprazole (PRILOSEC) 20 MG capsule; Take 1 capsule (20 mg total) by mouth daily. -     CBC; Future -     CMP14+EGFR; Future -     Lipid panel; Future  Gastroesophageal reflux disease, esophagitis presence not specified -     omeprazole (PRILOSEC) 20 MG capsule; Take 1 capsule (20 mg total) by mouth daily. INSTRUCTIONS: Avoid GERD Triggers: acidic, spicy or fried foods, caffeine, coffee, sodas,  alcohol and chocolate.     Follow Up Instructions Return for Fasting labs.     I discussed the assessment and treatment plan with the patient. The patient was provided an opportunity to ask questions and all were answered. The patient agreed with the plan and demonstrated an understanding of the instructions.   The patient was advised to call back or seek an in-person evaluation if the symptoms worsen  or if the condition fails to improve as anticipated.  I provided 23 minutes of non-face-to-face time during this encounter including median intraservice time, reviewing previous notes, labs, imaging, medications and explaining diagnosis and management.  Gildardo Pounds, FNP-BC

## 2019-04-10 ENCOUNTER — Encounter: Payer: Self-pay | Admitting: Nurse Practitioner

## 2019-04-10 MED ORDER — OMEPRAZOLE 20 MG PO CPDR: 20.0000 mg | DELAYED_RELEASE_CAPSULE | Freq: Every day | ORAL | 3 refills | Status: DC

## 2019-04-12 ENCOUNTER — Other Ambulatory Visit: Payer: Self-pay

## 2019-04-12 ENCOUNTER — Ambulatory Visit: Payer: Self-pay | Attending: Family Medicine

## 2019-04-12 DIAGNOSIS — Z7689 Persons encountering health services in other specified circumstances: Secondary | ICD-10-CM

## 2019-04-12 DIAGNOSIS — K219 Gastro-esophageal reflux disease without esophagitis: Secondary | ICD-10-CM

## 2019-04-12 DIAGNOSIS — R1011 Right upper quadrant pain: Secondary | ICD-10-CM

## 2019-04-12 MED FILL — OMEPRAZOLE 20 MG CAP: 20 | 30 days supply | Qty: 30 | Fill #0

## 2019-04-13 LAB — CMP14+EGFR
ALT: 15 IU/L (ref 0–32)
AST: 18 IU/L (ref 0–40)
Albumin/Globulin Ratio: 1.9 (ref 1.2–2.2)
Albumin: 4.3 g/dL (ref 3.8–4.8)
Alkaline Phosphatase: 91 IU/L (ref 39–117)
BUN/Creatinine Ratio: 18 (ref 9–23)
BUN: 12 mg/dL (ref 6–20)
Bilirubin Total: <0.2 mg/dL (ref 0.0–1.2)
CO2: 22 mmol/L (ref 20–29)
Calcium: 9.4 mg/dL (ref 8.7–10.2)
Chloride: 105 mmol/L (ref 96–106)
Creatinine, Ser: 0.66 mg/dL (ref 0.57–1.00)
GFR calc Af Amer: 129 mL/min/{1.73_m2} (ref >59)
GFR calc non Af Amer: 112 mL/min/{1.73_m2} (ref >59)
Globulin, Total: 2.3 g/dL (ref 1.5–4.5)
Glucose: 79 mg/dL (ref 65–99)
Potassium: 4.4 mmol/L (ref 3.5–5.2)
Sodium: 140 mmol/L (ref 134–144)
Total Protein: 6.6 g/dL (ref 6.0–8.5)

## 2019-04-13 LAB — CBC
Hematocrit: 38.7 % (ref 34.0–46.6)
Hemoglobin: 12.9 g/dL (ref 11.1–15.9)
MCH: 30.4 pg (ref 26.6–33.0)
MCHC: 33.3 g/dL (ref 31.5–35.7)
MCV: 91 fL (ref 79–97)
Platelets: 269 10*3/uL (ref 150–450)
RBC: 4.24 x10E6/uL (ref 3.77–5.28)
RDW: 12.9 % (ref 11.7–15.4)
WBC: 6.3 10*3/uL (ref 3.4–10.8)

## 2019-04-13 LAB — LIPID PANEL
Chol/HDL Ratio: 3.5 ratio (ref 0.0–4.4)
Cholesterol, Total: 173 mg/dL (ref 100–199)
HDL: 49 mg/dL (ref >39)
LDL Chol Calc (NIH): 98 mg/dL (ref 0–99)
Triglycerides: 151 mg/dL — ABNORMAL HIGH (ref 0–149)
VLDL Cholesterol Cal: 26 mg/dL (ref 5–40)

## 2019-04-13 LAB — HEMOGLOBIN A1C
Est. average glucose Bld gHb Est-mCnc: 97 mg/dL
Hgb A1c MFr Bld: 5 % (ref 4.8–5.6)

## 2019-04-14 LAB — H. PYLORI BREATH TEST: H pylori Breath Test: NEGATIVE

## 2019-04-21 ENCOUNTER — Ambulatory Visit: Payer: Self-pay

## 2019-04-28 ENCOUNTER — Ambulatory Visit: Payer: Self-pay | Attending: Family Medicine

## 2019-04-28 ENCOUNTER — Other Ambulatory Visit: Payer: Self-pay

## 2019-05-14 ENCOUNTER — Other Ambulatory Visit: Payer: Self-pay

## 2019-05-14 ENCOUNTER — Encounter: Payer: Self-pay | Admitting: Nurse Practitioner

## 2019-05-14 ENCOUNTER — Ambulatory Visit: Payer: Self-pay | Attending: Nurse Practitioner | Admitting: Nurse Practitioner

## 2019-05-14 VITALS — BP 101/68 | HR 70 | Temp 98.5°F | Ht 66.0 in | Wt 221.0 lb

## 2019-05-14 DIAGNOSIS — Z124 Encounter for screening for malignant neoplasm of cervix: Secondary | ICD-10-CM

## 2019-05-14 DIAGNOSIS — K219 Gastro-esophageal reflux disease without esophagitis: Secondary | ICD-10-CM

## 2019-05-14 DIAGNOSIS — R1011 Right upper quadrant pain: Secondary | ICD-10-CM

## 2019-05-14 MED ORDER — OMEPRAZOLE 20 MG PO CPDR: 20.0000 mg | DELAYED_RELEASE_CAPSULE | Freq: Two times a day (BID) | ORAL | 1 refills | Status: DC

## 2019-05-14 MED FILL — ?OMEPRAZOLE 20MG CAP DR: 20 | 30 days supply | Qty: 60 | Fill #0

## 2019-05-14 NOTE — Patient Instructions (Addendum)
Deteccin de cncer en las mujeres Cancer Screening for Women La deteccin de cncer es una prueba o examen que detecta el cncer. El [place] pruebas de deteccin de cncer especficas segn su edad y sus antecedentes personales y familiares de cncer. Trabaje con el mdico para establecer un programa de pruebas de deteccin de cncer que proteja su salud. Por qu se hacen las pruebas de deteccin de cncer? Las pruebas de deteccin se realizan para Public affairs consultant en [place] muy tempranas, antes de que se disemine y sea ms difcil tratarlo y antes de que usted comience a notar algn sntoma. Detectar el cncer de forma temprana mejora las posibilidades de xito del [place]. Esto puede salvar su vida. Quin debe hacerse las pruebas de deteccin de cncer? Todas las mujeres deberan examinarse para detectar ciertos cnceres que incluyen al cncer de mama, al cncer de cuello uterino y al cncer de piel. El mdico puede recomendarle que se haga pruebas de deteccin de otros tipos de cncer si:  Ha tenido Pharmacist, hospital.  Tiene un familiar con cncer.  Tiene genes anormales que podran incrementar el riesgo de cncer.  Tiene factores de riesgo para ciertos cnceres, como por ejemplo, fumar. Cundo debe realizarse las pruebas de deteccin de cncer depender de los siguientes factores:  Su edad.  Sus antecedentes mdicos y familiares.  Ciertos factores en el estilo de vida como, por ejemplo, fumar.  Exposicin a factores ambientales, como, por ejemplo, al asbesto. Cules son algunas de las pruebas de deteccin de cncer ms comunes? Cncer de mama La deteccin del cncer de mama se realiza con una prueba que toma imgenes del tejido mamario Arp). A continuacin se incluyen algunas instrucciones para las pruebas de deteccin:  Si tiene entre 40 y 78 aos de edad, puede optar si quiere o no comenzar a Sport and exercise psychologist.  Si tiene entre 66 y [address]  debe hacerse una Google.  Puede comenzar a [employer] de los 37 aos si tiene factores de riesgo de cncer de mama, como tener un familiar cercano con cncer de mama.  Si tiene 55 aos o ms, debe hacerse una [employer] Channel Communications cada 1 o 2 aos siempre que tenga buena salud y tenga una expectativa de vida de 10 aos o ms.  Es importante que conozca la apariencia de sus mamas y cmo se sienten al tacto para que pueda informar cualquier cambio a su mdico.  Cncer de cuello uterino La deteccin del cncer de cuello uterino se realiza con una prueba de Papanicolaou. Este examendetecta anomalas que incluyen el virus que causa el cncer de cuello uterino (virus del papiloma [place] o VPH). Para realizar [employer], un mdico toma una muestra de las clulas cervicales durante el examen plvico. La deteccin del cncer de cuello uterino con una prueba de Papanicolaou se debe realizar a partir de los 21 aos. A continuacin se incluyen algunas instrucciones para las pruebas de deteccin:  [employer] 21 y 29 aos, se debe realizar una prueba de Papanicolaou cada 3 aos.  Entre los 30 y 52 aos, debe realizarse una prueba de Papanicolaou y de VPH cada 5 aos o una prueba de Papanicolaou cada 3 aos.  Debe examinarse para detectar cncer de cuello uterino con ms frecuencia si tiene factores de riesgo para este tipo de cncer.  Si sus pruebas de Papanicolaou no son normales, se le debe realizar una prueba de VPH.  Si se ha vacunado contra el VPH, tambin se  la examinar para Film/video editor de cuello uterino y seguir las recomendaciones de pruebas de deteccin normales. Es posible que no la examinen para Public affairs consultant de cuello uterino si:  Es mayor de 68 aos y no ha tenido Information systems manager grave de cuello uterino o Programmer, systems los ltimos 20 aos.  El cuello del tero y el tero han sido extirpados y nunca ha tenido cncer de cuello uterino o Surveyor, mining.  Cncer de endometrio No existe una prueba de deteccin estndar para el cncer de endometrio aunque el cncer se puede detectar con:  Ardelia Mems prueba de una muestra de tejido tomada de la pared uterina (biopsia del tejido endometrial).  Una ecografa transvaginal.  Pruebas de Papanicolaou. Si corre ms riesgo de Animal nutritionist de endometrio, es posible que deba hacerse estas pruebas con ms frecuencia de lo normal. Puede correr ms riesgo si:  Tiene antecedentes familiares de cncer de ovarios, de tero o de colon.  Toma tamoxifeno, un medicamento que se [place] para Lawyer de mama.  Tiene ciertos tipos de cncer de colon. Si ha [employer], es muy importante que hable con su mdico acerca de cualquier sangrado o manchado vaginales. No se recomienda realizar una exploracin para Film/video editor de endometrio en mujeres que no tienen sntomas del cncer, por ejemplo, sangrado vaginal. Cncer colorrectal  Todos los adultos a Proofreader de los 53 aos y [facility] 65 aos deben hacerse pruebas de Programme researcher, broadcasting/film/video de Surveyor, minerals. El mdico puede recomendarle las pruebas de deteccin a partir de los 13 aos de edad. Le realizarn pruebas cada 1 a 10 aos, segn los [place] y el tipo de prueba de Programme researcher, broadcasting/film/video. Si tiene antecedentes familiares de cncer de colon o ano u otros factores de riesgo, es posible que deba comenzar a examinarse ms temprano. Hable con el mdico acerca de cul es la mejor prueba de deteccin para usted y con qu frecuencia debera examinarse. En las pruebas de deteccin de cncer colorrectal se busca cncer o crecimientos llamados plipos que con frecuencia se forman antes de que aparezca el cncer. Las pruebas de deteccin para cncer o plipos incluyen, entre otras:  Colonoscopia o sigmoidoscopia flexible. En estos estudios, se introduce un tubo flexible con una pequea cmara en el recto.  Colonografa computarizada. Este estudio [place] radiografas y un tinte para  buscar plipos en el colon. Si se encuentra un plipo, es posible que le realicen una colonoscopia para Orthoptist el plipo y Ecologist. Las pruebas para Film/video editor en la materia fecal (heces) incluyen:  Prueba de sangre oculta en la materia fecal con guayacol (PSOMF). Este anlisis detecta sangre en la materia fecal. Se puede hacer fcilmente en casa con un kit.  Prueba inmunoqumica fecal (PIF). Este anlisis detecta sangre en la materia fecal. Para realizar este estudio, deber recoger las muestras de materia fecal en su casa.  Prueba de cido desoxirribonucleico (ADN) en heces. Esta prueba detecta sangre en la materia fecal y cualquier cambio en el ADN que pueda producir cncer de colon. Es probable que se le pida que recoja una muestra de materia fecal en su casa y la lleve al laboratorio para Animal nutritionist.  Cncer de piel La deteccin del cncer de piel se realiza examinando la piel para detectar lunares o manchas atpicos y cualquier cambio en los lunares existentes. El mdico deber examinarle la piel para detectar signos de cncer de piel en cada examen fsico que se realice. Debe examinarse la piel todos los  meses y decirle al mdico de inmediato si nota algo fuera de lo habitual. Las mujeres con riesgo de cncer de piel superior al normal debern consultar a un especialista de la piel (dermatlogo) para un examen anual del cuerpo. Cncer de pulmn La prueba de deteccin de cncer de pulmn se realiza con una exploracin por tomografa computarizada (TC) que detecta clulas anormales en los pulmones. Analice las pruebas de deteccin de cncer de pulmn con su mdico si tiene entre 24 y 80aos y si:  Fuma actualmente.  Sola fumar mucho.  Tiene antecedentes de haber fumado 1 paquete diario durante 30 aos o 2 paquetes diarios durante 15 aos.  Ha dejado el hbito de fumar en algn momento en los ltimos 15aos. Si fuma mucho o si sola fumar mucho, debern examinarlo  todos los aos. Dnde buscar ms informacin  [place] ([place]): SkinPromotion.no  Centros para el Control y la Prevencin de Probation officer for Disease Control and Prevention, CDC): http://knight-sullivan.biz/  Departamento de Salud y Conservation officer, nature (Department of Health and [employer]): BankingDetective.si Comuncate con un mdico si:  Si tiene alguna preocupacin por signos o sntomas de cncer, como, por ejemplo: ? Lunares que tienen una forma o color atpicos. ? Cambios en los lunares existentes. ? Tiene una llaga que no cicatriza. ? The [employer] American. ? Tiene cansancio que no desaparece. ? Dolor o calambres frecuentes en el abdomen. ? Tos o escupir sangre al toser. ? Perder peso sin proponrselo. ? Bultos o Auto[employer]Owners Insurance. ? Speed vaginal, o cambios en los perodos [place]. Resumen  Est atenta y observe si hay signos y sntomas de cncer, especialmente sntomas de cncer de mama, de cuello uterino, de endometrio, colorrectal, de piel y de pulmn.  La deteccin temprana del cncer con las pruebas de deteccin de cncer puede salvarle la vida.  Hable con el mdico sobre los riesgos especficos que tiene de Chief Financial Officer.  Genevive Bi junto al mdico para Engineer, agricultural de deteccin de cncer que sea adecuado para usted. Esta informacin no tiene Marine scientist el consejo del mdico. Asegrese de hacerle al mdico cualquier pregunta que tenga. Document Released: 10/08/2016 Document Revised: 11/12/2017 Elsevier Patient Education  [place] Papanicolaou Pap Test Por qu me debo realizar esta prueba? La prueba de Papanicolaou, tambin denominada citologa vaginal, es una prueba de cribado para Hydrographic surveyor signos de:  Cncer de la vagina, del cuello  uterino y del tero. El cuello uterino es la parte baja del tero que se abre hacia la vagina.  Infeccin.  Cambios que podran ser un signo de que se est desarrollando un cncer (cambios precancerosos). Las mujeres deben realizarse esta prueba con regularidad. En general, debe hacerse una prueba de Papanicolaou cada 3 aos hasta alcanzar la menopausia o hasta los 65 aos. Las [employer] 30 y 1 aos de edad pueden elegir realizarse la prueba de Papanicolaou al mismo tiempo que la prueba del VPH (virus del papiloma humano) cada 5 aos (en lugar de cada 3 aos). El mdico puede recomendarle que se realice pruebas de Papanicolaou con ms o menos frecuencia en funcin de sus afecciones mdicas y los resultados de la prueba de Papanicolaou anterior. Qu tipo de [place] se toma?  El mdico recolectar una muestra de clulas de la superficie del cuello uterino. Lo har utilizando un pequeo hisopo de algodn, una esptula de plstico o un cepillo. Esta muestra se Multimedia programmer un  examen plvico, mientras usted est recostada boca arriba sobre la mesa de examen con los pies en los descansos para pies (estribos). En algunos casos, tambin pueden recolectarse fluidos (secreciones) del cuello uterino y la vagina. Cmo debo prepararme para esta prueba?  Tenga en cuenta en qu etapa del ciclo menstrual se encuentra. Es posible que se le pida que vuelva a Risk manager la prueba si est Forensic psychologist en que debe Radiation protection practitioner.  Si el da en que debe realizarse la prueba tiene una infeccin vaginal aparente, deber volver a Editor, commissioning prueba.  Siga las indicaciones del mdico acerca de lo siguiente: ? Cambiar o suspender los medicamentos que toma habitualmente. Algunos medicamentos pueden [employer] de la prueba, como los digitlicos y Lexicographer. ? Evite las duchas vaginales o los baos de inmersin el da de la prueba o Games developer anterior. Informe al mdico acerca de lo  siguiente:  Cualquier alergia que tenga.  Todos los [employer], incluidos vitaminas, hierbas, gotas oftlmicas, cremas y medicamentos de venta libre.  Cualquier enfermedad de la sangre que tenga.  Cirugas a las que se someti.  Cualquier afeccin mdica que tenga.  Si est embarazada o podra estarlo. Cmo se informan los resultados? Los [employer] de la prueba se informarn como anormales o normales. Puede producirse un resultado positivo falso. Este tipo de resultado es incorrecto porque indica que una enfermedad est presente cuando en realidad no lo est. Puede producirse un resultado negativo falso. Este tipo de resultado es incorrecto porque indica que una enfermedad no est presente cuando en realidad lo est. Qu significan los [place]? Un resultado normal en la prueba significa que no tiene signos de cncer de la vagina, del cuello uterino o del tero. Un resultado anormal puede significar que tiene:  Cncer. Una prueba de Papanicolaou por s sola no es suficiente para Community education officer. En este caso, se le realizarn ms pruebas.  Cambios precancerosos en la vagina, cuello uterino o tero.  Inflamacin del cuello uterino.  Enfermedades de transmisin sexual (ETS).  Infecciones por hongos.  Infecciones por parsitos. Hable con su mdico sobre lo que significan sus [place]. Preguntas para hacerle al mdico Consulte a su mdico o pregunte en el departamento donde se realiza la prueba acerca de lo siguiente:  Cundo estarn disponibles mis resultados?  Cmo obtendr mis resultados?  Cules son mis opciones de tratamiento?  Qu otras pruebas necesito?  Cules son los prximos pasos que debo seguir? Resumen  En general, las mujeres deben hacerse una prueba de Papanicolaou cada 3 aos Teacher, English as a foreign language la menopausia o [facility] 42 aos de [place].  El mdico recolectar una muestra de clulas de la superficie del cuello uterino. Lo har  utilizando un pequeo hisopo de algodn, una esptula de plstico o un cepillo.  En algunos casos, tambin pueden recolectarse fluidos (secreciones) del cuello uterino y la vagina. Esta informacin no tiene Marine scientist el consejo del mdico. Asegrese de hacerle al mdico cualquier pregunta que tenga. Document Released: 01/15/2008 Document Revised: 07/08/2017 Document Reviewed: 07/08/2017 Elsevier Patient Education  2020 [place] de alimentos para pacientes adultos con enfermedad de reflujo gastroesofgico Food Choices for Gastroesophageal Reflux Disease, Adult Si tiene enfermedad de reflujo gastroesofgico (ERGE), los alimentos que consume y los hbitos de alimentacin son Theatre stage manager. Elegir los alimentos adecuados puede ayudar a [employer]. Piense en consultar a un especialista en nutricin (nutricionista) para que lo ayude a Warehouse manager. Consejos para seguir Principal Financial  Comidas  Elija alimentos saludables con bajo contenido de grasa, como frutas, verduras, cereales integrales, productos lcteos descremados y carne [place] de [place], de pescado y de [place].  Haga comidas pequeas durante Psychiatrist de 3 comidas abundantes. Coma lentamente y en un lugar donde est distendido. Evite agacharse o recostarse hasta 2 o 3horas despus de haber comido.  Evite comer 2 a 3horas antes de ir a acostarse.  Evite beber grandes cantidades de lquidos con las comidas.  Evite frer los alimentos a la hora de la coccin. Puede hornear, grillar o asar a la parrilla.  Evite o limite la cantidad de: ? Chocolate. ? Menta y mentol. ? Alcohol. ? Pimienta. ? Caf negro y descafeinado. ? T negro y descafeinado. ? Bebidas con gas (gaseosas). ? Bebidas energizantes y refrescos que contengan cafena.  Limite los alimentos con alto contenido de [place], por ejemplo: ? Carnes grasas o alimentos fritos. ? Leche entera, crema, manteca o helado. ? Nueces y  [place] de frutos secos. ? Pastelera, donas y dulces hechos con [place] o [place].  Evite los alimentos que le ocasionen sntomas. Estos pueden ser distintos para Higher education careers adviser. Los alimentos que suelen causan sntomas son los siguientes: ? Risk analyst. ? Naranjas, limones y limas. ? Pimientos. ? Comidas condimentadas. ? [place] y [place]. ? Vinagre. Estilo de vida  Mantenga un peso saludable. Pregntele a su mdico cul es el peso saludable para usted. Si necesita perder peso, hable con su mdico para hacerlo de manera segura.  Realice actividad fsica durante, al menos, 30 minutos 5 das por semana o ms, o segn lo indicado por su mdico.  Use ropa suelta.  No fume. Si necesita ayuda para dejar de fumar, consulte al mdico.  Duerma con la cabecera de la cama ms elevada que los pies. Use una cua debajo del colchn o bloques debajo del armazn de la cama para Theatre manager la cabecera de la cama elevada. Resumen  Si tiene enfermedad de reflujo gastroesofgico (ERGE), las elecciones de alimentos y el [place] de vida son muy importantes para ayudar a Public house manager los sntomas.  Haga comidas pequeas durante Psychiatrist de 3 comidas abundantes. Coma lentamente y en un lugar donde est distendido.  Limite los alimentos con alto contenido graso como la carne grasa o los alimentos fritos.  Evite agacharse o recostarse hasta 2 o 3horas despus de haber comido.  Evite la menta y [place] buena, la cafena, el alcohol y el chocolate. Esta informacin no tiene Marine scientist el consejo del mdico. Asegrese de hacerle al mdico cualquier pregunta que tenga. Document Released: 01/28/2012 Document Revised: 03/04/2017 Document Reviewed: 03/04/2017 Elsevier Patient Education  [place] estomacal Heartburn La acidez estomacal es un tipo de dolor o Tree surgeon que puede sentirse en la garganta o en el pecho. Con frecuencia se describe como un dolor urente (ardor). Tambin puede  causar mal sabor en la boca que se siente cido. La acidez estomacal puede empeorar al acostarse o al inclinarse. Puede ser peor por la noche. Puede ser ocasionada porque el contenido del estmago vuelve hacia arriba (reflujo) por el tubo que conecta la boca con el estmago (esfago). Siga estas indicaciones en su casa: Comida y bebida   Evite determinados alimentos y bebidas como se lo haya indicado el mdico. Estos pueden incluir: ? Caf y t (con o sin cafena). ? Bebidas que contengan alcohol. ? Bebidas energticas y deportivas. ? Bebidas gaseosas o refrescos. ? Chocolate y cacao. ? Menta  y [place]. ? Ajo y cebolla. ? Rbano picante. ? Alimentos cidos y condimentados, tales como:  Pimientos.  [place] en polvo y curry en polvo.  Vinagre.  Salsas picantes y [employer]. ? Los ctricos y sus jugos, tales como:  [place].  Limones.  Limas. ? Alimentos a base de tomate, tales como:  Salsa roja y pizza con salsa roja.  [place].  Salsa. ? Alimentos fritos y Research officer, political party, tales como:  [place].  Papas fritas y papas fritas de bolsa.  Aderezos con alto contenido de Lobbyist. ? Carnes con alto contenido de [place], tales como:  Perros calientes y Engineer, petroleum.  Chuletas.  Jamn y tocino. ? Productos lcteos con alto contenido de [place], tales como:  Leche entera.  [place].  Queso crema.  Consuma pequeas cantidades de comida con ms frecuencia. Evite consumir porciones abundantes.  Evite beber grandes cantidades de lquidos con las comidas.  Evite comer 2 o 3horas antes de acostarse.  Evite recostarse inmediatamente despus de comer.  No haga ejercicios enseguida despus de comer. Estilo de vida      Si tiene sobrepeso, baje una cantidad de peso saludable para usted. Consulte a su mdico para bajar de peso de [employer].  No consuma ningn producto que contenga nicotina o tabaco, lo que incluye cigarrillos, cigarrillos electrnicos y tabaco  de Higher education careers adviser. Estos pueden empeorar los sntomas. Si necesita ayuda para dejar de fumar, consulte al mdico.  Use ropa holgada. No use nada apretado alrededor de la cintura.  Levante (eleve) la cabecera de la cama aproximadamente 6pulgadas (15cm) para dormir.  Intente reducir [employer] de estrs. Si necesita ayuda para hacer esto, consulte al mdico. Indicaciones generales  Est atento a cualquier cambio en los sntomas.  Tome los medicamentos de venta libre y los recetados solamente como se lo haya indicado el mdico. ? No tome aspirina, ibuprofeno ni otros antiinflamatorios no esteroideos (AINE) a menos que el mdico lo autorice. ? Deje de tomar medicamentos solamente como se lo haya indicado el mdico.  Concurra a todas las visitas de seguimiento como se lo haya indicado el mdico. Esto es importante. Comunquese con un mdico si:  Aparecen nuevos sntomas.  Adelgaza y no sabe por qu est sucediendo esto.  Tiene problemas para tragar, o le duele cuando traga.  Tiene sibilancia o tos persistente.  Los sntomas no mejoran con Dispensing optician.  Tiene acidez estomacal con frecuencia durante ms de 2semanas. Solicite ayuda inmediatamente si:  [employer], el cuello, la [place], los dientes o la espalda.  Se siente transpirado, mareado o tiene una sensacin de desvanecimiento.  Siente falta de aire o Tourist information centre manager.  Vomita, y el vmito tiene un aspecto similar a la sangre o a los posos de caf.  Las deposiciones (heces) son sanguinolentas o negras. Estos sntomas pueden representar un problema grave que constituye Engineer, maintenance (IT). No espere a ver si los sntomas desaparecen. Solicite atencin mdica de inmediato. Comunquese con el servicio de emergencias de su localidad (911 en los [place]). No conduzca por sus propios medios [employer] hospital. Resumen  La acidez estomacal es un tipo de dolor que puede sentirse en la garganta o en el pecho. Puede  sentirse como un dolor urente (ardor). Tambin puede causar mal sabor en la boca que se siente cido.  Es posible que deba evitar ciertos alimentos y bebidas para ayudar a Public house manager los sntomas. Pregntele al mdico qu alimentos y bebidas Nurse, adult.  [employer] de  venta libre y los recetados solamente como se lo haya indicado el mdico. No tome aspirina, ibuprofeno ni otros antiinflamatorios no esteroideos (AINE) a menos que el mdico se lo haya indicado.  Comunquese con el mdico si los sntomas no mejoran o si empeoran. Esta informacin no tiene Marine scientist el consejo del mdico. Asegrese de hacerle al mdico cualquier pregunta que tenga. Document Released: 04/10/2011 Document Revised: 02/05/2018 Document Reviewed: 02/05/2018 Elsevier Patient Education  2020 [employer].

## 2019-05-14 NOTE — Progress Notes (Signed)
Assessment & Plan:  Sara [name] was seen today for gynecologic exam.  Diagnoses and all orders for this visit:  Encounter for Papanicolaou smear for cervical cancer screening -     Cytology - PAP -     Cervicovaginal ancillary only  Abdominal pain, right upper quadrant -     omeprazole (PRILOSEC) 20 MG capsule; Take 1 capsule (20 mg total) by mouth 2 (two) times daily before a meal.  Gastroesophageal reflux disease -     omeprazole (PRILOSEC) 20 MG capsule; Take 1 capsule (20 mg total) by mouth 2 (two) times daily before a meal. INSTRUCTIONS: Avoid GERD Triggers: acidic, spicy or fried foods, caffeine, coffee, sodas,  alcohol and chocolate.   Patient has been counseled on age-appropriate routine health concerns for screening and prevention. These are reviewed and up-to-date. Referrals have been placed accordingly. Immunizations are up-to-date or declined.    Subjective:   Chief Complaint  Patient presents with  . Gynecologic Exam    Pt. is here for pap smear.    HPI Sara [name] 38 y.o. female presents to office`` today for PAP.  Review of Systems  Constitutional: Negative.  Negative for chills, fever, malaise/fatigue and weight loss.  Respiratory: Negative.  Negative for cough, shortness of breath and wheezing.   Cardiovascular: Negative.  Negative for chest pain, orthopnea and leg swelling.  Gastrointestinal: Positive for heartburn. Negative for abdominal pain.  Genitourinary: Negative.  Negative for flank pain.  Skin: Negative.  Negative for rash.  Psychiatric/Behavioral: Negative for suicidal ideas.    Past Medical History:  Diagnosis Date  . GERD (gastroesophageal reflux disease)     Past Surgical History:  Procedure Laterality Date  . CESAREAN SECTION     3x    Family History  Problem Relation Age of Onset  . Diabetes Neg Hx   . Hypertension Neg Hx     Social History Reviewed with no changes to be made today.   Outpatient Medications Prior to Visit   Medication Sig Dispense Refill  . omeprazole (PRILOSEC) 20 MG capsule Take 1 capsule (20 mg total) by mouth daily. (Patient not taking: Reported on 05/14/2019) 30 capsule 3   No facility-administered medications prior to visit.     No Known Allergies     Objective:    BP 101/68 (BP Location: Left Arm, Patient Position: Sitting, Cuff Size: Large)   Pulse 70   Temp 98.5 F (36.9 C) (Oral)   Ht 5\' 6"  (1.676 m)   Wt 221 lb (100.2 kg)   LMP 05/03/2019   SpO2 98%   BMI 35.67 kg/m  Wt Readings from Last 3 Encounters:  05/14/19 221 lb (100.2 kg)  02/05/16 187 lb 6.4 oz (85 kg)  04/03/15 186 lb 3.2 oz (84.5 kg)    Physical Exam Constitutional:      Appearance: She is well-developed.  HENT:     Head: Normocephalic.  Cardiovascular:     Rate and Rhythm: Normal rate and regular rhythm.     Heart sounds: Normal heart sounds.  Pulmonary:     Effort: Pulmonary effort is normal.     Breath sounds: Normal breath sounds.  Abdominal:     General: Bowel sounds are normal.     Palpations: Abdomen is soft.     Hernia: There is no hernia in the left inguinal area.  Genitourinary:    Labia:        Right: No rash, tenderness, lesion or injury.  Left: No rash, tenderness, lesion or injury.      Vagina: Normal. No signs of injury and foreign body. No vaginal discharge, erythema, tenderness or bleeding.     Cervix: No cervical motion tenderness or friability.     Uterus: Not deviated and not enlarged.      Adnexa:        Right: No mass, tenderness or fullness.         Left: No mass, tenderness or fullness.       Rectum: Normal. No external hemorrhoid.  Lymphadenopathy:     Lower Body: No right inguinal adenopathy. No left inguinal adenopathy.  Skin:    General: Skin is warm and dry.  Neurological:     Mental Status: She is alert and oriented to person, place, and time.  Psychiatric:        Behavior: Behavior normal.        Thought Content: Thought content normal.         Judgment: Judgment normal.          Patient has been counseled extensively about nutrition and exercise as well as the importance of adherence with medications and regular follow-up. The patient was given clear instructions to go to ER or return to medical center if symptoms don't improve, worsen or new problems develop. The patient verbalized understanding.   Follow-up: Return in about 3 weeks (around 06/04/2019) for GERD.   Claiborne Rigg, FNP-BC [facility] [place], [place] [phone]   05/14/2019, 3:22 PM

## 2019-05-20 LAB — CERVICOVAGINAL ANCILLARY ONLY
Bacterial Vaginitis (gardnerella): NEGATIVE
Candida Glabrata: NEGATIVE
Candida Vaginitis: POSITIVE — AB
Chlamydia: NEGATIVE
Neisseria Gonorrhea: NEGATIVE
Trichomonas: NEGATIVE

## 2019-05-21 LAB — CYTOLOGY - PAP
Adequacy: ABSENT
Diagnosis: NEGATIVE
High risk HPV: NEGATIVE

## 2019-05-24 ENCOUNTER — Other Ambulatory Visit: Payer: Self-pay | Admitting: Nurse Practitioner

## 2019-05-24 MED ORDER — FLUCONAZOLE 150 MG PO TABS: 150.0000 mg | ORAL_TABLET | Freq: Once | ORAL | 0 refills | Status: AC

## 2019-05-24 MED FILL — FLUCONAZOLE 150 MG TABLET: 150 | 1 days supply | Qty: 1 | Fill #0

## 2019-06-08 ENCOUNTER — Other Ambulatory Visit: Payer: Self-pay

## 2019-06-08 ENCOUNTER — Ambulatory Visit: Payer: Self-pay | Attending: Nurse Practitioner | Admitting: Nurse Practitioner

## 2019-07-13 ENCOUNTER — Other Ambulatory Visit: Payer: Self-pay

## 2019-07-13 ENCOUNTER — Encounter: Payer: Self-pay | Admitting: Nurse Practitioner

## 2019-07-13 ENCOUNTER — Ambulatory Visit: Payer: Self-pay | Attending: Nurse Practitioner | Admitting: Nurse Practitioner

## 2019-07-13 DIAGNOSIS — K219 Gastro-esophageal reflux disease without esophagitis: Secondary | ICD-10-CM

## 2019-07-13 DIAGNOSIS — R1011 Right upper quadrant pain: Secondary | ICD-10-CM

## 2019-07-13 MED ORDER — OMEPRAZOLE 20 MG PO CPDR: 20.0000 mg | DELAYED_RELEASE_CAPSULE | Freq: Two times a day (BID) | ORAL | 1 refills | Status: DC

## 2019-07-13 MED FILL — ?OMEPRAZOLE 20MG CAP DR: 20 | 90 days supply | Qty: 180 | Fill #0

## 2019-07-13 NOTE — Progress Notes (Signed)
Virtual Visit via Telephone Note Due to national recommendations of social distancing due to COVID 19, telehealth visit is felt to be most appropriate for this patient at this time.  I discussed the limitations, risks, security and privacy concerns of performing an evaluation and management service by telephone and the availability of in person appointments. I also discussed with the patient that there may be a patient responsible charge related to this service. The patient expressed understanding and agreed to proceed.    I connected with Sara [name] on 07/13/19  at   2:10 PM EST  EDT by telephone and verified that I am speaking with the correct person using two identifiers.   Consent I discussed the limitations, risks, security and privacy concerns of performing an evaluation and management service by telephone and the availability of in person appointments. I also discussed with the patient that there may be a patient responsible charge related to this service. The patient expressed understanding and agreed to proceed.   Location of Patient: Private Residence   Location of Provider: [facility]    Persons participating in Telemedicine visit: Sara [name] Sara [name] Sara [name]  Sara [name] [id number] Spanish Interpreter   History of Present Illness: Telemedicine visit for: GERD  has a past medical history of GERD (gastroesophageal reflux disease).  GERD: Patient complains of worsening symptoms of heartburn since she ran out of her omeprazole. This has been associated with burning sensation in her throat and abdominal pain.  She denies choking on food, fullness after meals, hematemesis, melena, odynophagia and regurgitation of undigested food. Symptoms have been present for a few weeks. She denies dysphagia.  She has not lost weight. She denies melena, hematochezia, hematemesis, and coffee ground emesis. Medical therapy in the past has  included proton pump inhibitors.   Past Medical History:  Diagnosis Date  . GERD (gastroesophageal reflux disease)     Past Surgical History:  Procedure Laterality Date  . CESAREAN SECTION     3x    Family History  Problem Relation Age of Onset  . Diabetes Neg Hx   . Hypertension Neg Hx     Social History   Socioeconomic History  . Marital status: Legally Separated    Spouse name: Not on file  . Number of children: Not on file  . Years of education: Not on file  . Highest education level: Not on file  Occupational History  . Not on file  Social Needs  . Financial resource strain: Not on file  . Food insecurity    Worry: Not on file    Inability: Not on file  . Transportation needs    Medical: Not on file    Non-medical: Not on file  Tobacco Use  . Smoking status: Never Smoker  . Smokeless tobacco: Never Used  Substance and Sexual Activity  . Alcohol use: Yes    Comment: socially  . Drug use: No  . Sexual activity: Yes  Lifestyle  . Physical activity    Days per week: Not on file    Minutes per session: Not on file  . Stress: Not on file  Relationships  . Social Musician on phone: Not on file    Gets together: Not on file    Attends religious service: Not on file    Active member of club or organization: Not on file    Attends meetings of clubs or organizations: Not on file  Relationship status: Not on file  Other Topics Concern  . Not on file  Social History Narrative   ** Merged History Encounter **         Observations/Objective: Awake, alert and oriented x 3   Review of Systems  Constitutional: Negative for fever, malaise/fatigue and weight loss.  HENT: Negative.  Negative for nosebleeds.   Eyes: Negative.  Negative for blurred vision, double vision and photophobia.  Respiratory: Negative.  Negative for cough and shortness of breath.   Cardiovascular: Negative.  Negative for chest pain, palpitations and leg swelling.   Gastrointestinal: Positive for abdominal pain and heartburn. Negative for blood in stool, constipation, diarrhea, melena, nausea and vomiting.  Musculoskeletal: Negative.  Negative for myalgias.  Neurological: Negative.  Negative for dizziness, focal weakness, seizures and headaches.  Psychiatric/Behavioral: Negative.  Negative for suicidal ideas.    Assessment and Plan: Sara [name] was seen today for gastroesophageal reflux.  Diagnoses and all orders for this visit:  Gastroesophageal reflux disease, unspecified whether esophagitis present -     omeprazole (PRILOSEC) 20 MG capsule; Take 1 capsule (20 mg total) by mouth 2 (two) times daily before a meal. Pls fill as 90 day supply INSTRUCTIONS: Avoid GERD Triggers: acidic, spicy or fried foods, caffeine, coffee, sodas,  alcohol and chocolate.   Abdominal pain, right upper quadrant -     omeprazole (PRILOSEC) 20 MG capsule; Take 1 capsule (20 mg total) by mouth 2 (two) times daily before a meal. Pls fill as 90 day supply     Follow Up Instructions Return in about 3 months (around 10/11/2019).     I discussed the assessment and treatment plan with the patient. The patient was provided an opportunity to ask questions and all were answered. The patient agreed with the plan and demonstrated an understanding of the instructions.   The patient was advised to call back or seek an in-person evaluation if the symptoms worsen or if the condition fails to improve as anticipated.  I provided 12 minutes of non-face-to-face time during this encounter including median intraservice time, reviewing previous notes, labs, imaging, medications and explaining diagnosis and management.  Gildardo Pounds, [name]

## 2019-09-08 ENCOUNTER — Encounter (HOSPITAL_COMMUNITY): Payer: Self-pay | Admitting: Emergency Medicine

## 2019-09-08 ENCOUNTER — Other Ambulatory Visit: Payer: Self-pay

## 2019-09-08 ENCOUNTER — Ambulatory Visit (HOSPITAL_COMMUNITY)
Admission: EM | Admit: 2019-09-08 | Discharge: 2019-09-08 | Disposition: A | Discharge status: Home / Self Care | Payer: Self-pay

## 2019-09-08 DIAGNOSIS — R200 Anesthesia of skin: Secondary | ICD-10-CM

## 2019-09-08 NOTE — ED Provider Notes (Signed)
[facility]   MRN: [id number] DOB: [birth year]  Subjective:   Sara [name] is a 39 y.o. female presenting for 2 week history of intermittent left sided facial, arm and leg numbness. Symptoms have improved but remain intermittent, worst of her left arm. Patient is working night shift, feels like she does not rest well. Also had full work up in 03/2019 and had normal cmet, cbc, a1c, lipid panel.   No current facility-administered medications for this encounter.  Current Outpatient Medications:  .  omeprazole (PRILOSEC) 20 MG capsule, Take 1 capsule (20 mg total) by mouth 2 (two) times daily before a meal. Pls fill as 90 day supply, Disp: 180 capsule, Rfl: 1   No Known Allergies  Past Medical History:  Diagnosis Date  . GERD (gastroesophageal reflux disease)      Past Surgical History:  Procedure Laterality Date  . CESAREAN SECTION     3x    Family History  Problem Relation Age of Onset  . Diabetes Neg Hx   . Hypertension Neg Hx     Social History   Tobacco Use  . Smoking status: Never Smoker  . Smokeless tobacco: Never Used  Substance Use Topics  . Alcohol use: Yes    Comment: socially  . Drug use: No    Review of Systems  Constitutional: Negative for fever and malaise/fatigue.  HENT: Negative for congestion, ear pain, sinus pain and sore throat.   Eyes: Negative for discharge and redness.  Respiratory: Negative for cough, hemoptysis, shortness of breath and wheezing.   Cardiovascular: Negative for chest pain.  Gastrointestinal: Negative for abdominal pain, diarrhea, nausea and vomiting.  Genitourinary: Negative for dysuria, flank pain and hematuria.  Musculoskeletal: Negative for myalgias.  Skin: Negative for rash.  Neurological: Negative for dizziness, tingling, sensory change, speech change, focal weakness, seizures, weakness and headaches.  Psychiatric/Behavioral: Negative for depression and substance abuse.     Objective:   Vitals:  There were no vitals taken for this visit.  Wt Readings from Last 3 Encounters:  05/14/19 221 lb (100.2 kg)  02/05/16 187 lb 6.4 oz (85 kg)  04/03/15 186 lb 3.2 oz (84.5 kg)   Temp Readings from Last 3 Encounters:  05/14/19 98.5 F (36.9 C) (Oral)  03/05/19 98.5 F (36.9 C)  01/05/17 98 F (36.7 C) (Oral)   BP Readings from Last 3 Encounters:  05/14/19 101/68  03/05/19 113/79  01/05/17 (!) 109/58   Pulse Readings from Last 3 Encounters:  05/14/19 70  03/05/19 65  01/05/17 (!) 58   Physical Exam Constitutional:      General: She is not in acute distress.    Appearance: Normal appearance. She is well-developed. She is not ill-appearing, toxic-appearing or diaphoretic.  HENT:     Head: Normocephalic and atraumatic.     Nose: Nose normal.     Mouth/Throat:     Mouth: Mucous membranes are moist.  Eyes:     Extraocular Movements: Extraocular movements intact.     Pupils: Pupils are equal, round, and reactive to light.  Neck:     Vascular: No carotid bruit.  Cardiovascular:     Rate and Rhythm: Normal rate and regular rhythm.     Pulses: Normal pulses.     Heart sounds: Normal heart sounds. No murmur. No friction rub. No gallop.   Pulmonary:     Effort: Pulmonary effort is normal. No respiratory distress.     Breath sounds: Normal breath sounds. No stridor. No wheezing, rhonchi or  rales.  Musculoskeletal:     Cervical back: Normal range of motion and neck supple.  Skin:    General: Skin is warm and dry.     Findings: No rash.  Neurological:     Mental Status: She is alert and oriented to person, place, and time.     Cranial Nerves: No cranial nerve deficit.     Sensory: No sensory deficit.     Motor: No weakness.     Coordination: Coordination normal.     Gait: Gait normal.     Deep Tendon Reflexes: Reflexes normal.  Psychiatric:        Mood and Affect: Mood normal.        Behavior: Behavior normal.        Thought Content: Thought content normal.         Judgment: Judgment normal.      Assessment and Plan :   1. Left sided numbness     Physical exam findings, vital signs very reassuring against acute intracranial process.  Patient has also had normal lab panel as recently as August 2020.  I offered to recheck but patient was not interested in this.  Discussed signs of stroke, intracranial process including weakness, loss of sensation, severe headache, confusion, dizziness.  Patient will report to the emergency room if these develop.  Provided her with a note for work for the next 3 days, emphasized patient consider switching off of third shift as I suspect this is really taxing on her. Counseled patient on potential for adverse effects with medications prescribed/recommended today, ER and return-to-clinic precautions discussed, patient verbalized understanding.    Jaynee Eagles, PA-C 09/08/19 1053

## 2019-09-08 NOTE — ED Triage Notes (Signed)
Left sided facial numbness with left arm and left leg numbness. Onset 2 weeks ago. Condition has improved since then. Just the arm numbness has lingered. Other symptoms have been intermittent since onset.

## 2019-10-22 ENCOUNTER — Ambulatory Visit: Payer: Self-pay | Admitting: Nurse Practitioner

## 2019-10-22 ENCOUNTER — Other Ambulatory Visit: Payer: Self-pay

## 2019-11-03 ENCOUNTER — Encounter: Payer: Self-pay | Admitting: Nurse Practitioner

## 2019-11-03 ENCOUNTER — Other Ambulatory Visit: Payer: Self-pay

## 2019-11-03 ENCOUNTER — Ambulatory Visit: Payer: Self-pay | Attending: Nurse Practitioner | Admitting: Nurse Practitioner

## 2019-11-03 DIAGNOSIS — K219 Gastro-esophageal reflux disease without esophagitis: Secondary | ICD-10-CM

## 2019-11-03 MED ORDER — PANTOPRAZOLE SODIUM 40 MG PO TBEC: 40.0000 mg | DELAYED_RELEASE_TABLET | Freq: Two times a day (BID) | ORAL | 1 refills | Status: DC

## 2019-11-03 MED FILL — ?PANTOPRAZOLE SO DR 40MG TA: 40 | 30 days supply | Qty: 60 | Fill #0

## 2019-11-03 NOTE — Progress Notes (Signed)
Virtual Visit via Telephone Note Due to national recommendations of social distancing due to [place] 19, telehealth visit is felt to be most appropriate for this patient at this time.  I discussed the limitations, risks, security and privacy concerns of performing an evaluation and management service by telephone and the availability of in person appointments. I also discussed with the patient that there may be a patient responsible charge related to this service. The patient expressed understanding and agreed to proceed.    I connected with Sara [name] on 11/03/19  at   2:10 PM EDT  EDT by telephone and verified that I am speaking with the correct person using two identifiers.   Consent I discussed the limitations, risks, security and privacy concerns of performing an evaluation and management service by telephone and the availability of in person appointments. I also discussed with the patient that there may be a patient responsible charge related to this service. The patient expressed understanding and agreed to proceed.   Location of Patient: Private Residence    Location of Provider: [place] [facility] [employer] [facility]    Persons participating in Telemedicine visit: Geryl Rankins FNP-BC [place]   Spanish Interpreter ID [phone]   History of Present Illness: Telemedicine visit for: Abdominal Pain/GERD  GERD Was prescribed omeprazole 20 mg BID last month due to complaints of  worsening symptoms of heartburn. At that time she had run out of her omeprazole and started experiencing increased epigastric pain, abdominal pain (generalized) and  burning sensation in her throat.  She denies choking on food, fullness after meals, hematemesis, melena, odynophagia and regurgitation of undigested food. She denies dysphagia.  She has not lost weight. She denies melena, hematochezia, hematemesis, and coffee ground emesis. Medical therapy in the past has included  proton pump inhibitors. Today she states symptoms are still present despite medication adherence. Will switch to pantoprazole and have her evaluated by GI. She does recognize that pain is worse with certain acidic foods which she tries to avoid.    Past Medical History:  Diagnosis Date  . GERD (gastroesophageal reflux disease)     Past Surgical History:  Procedure Laterality Date  . CESAREAN SECTION     3x    Family History  Problem Relation Age of Onset  . Diabetes Neg Hx   . Hypertension Neg Hx     Social History   Socioeconomic History  . Marital status: Married    Spouse name: Not on file  . Number of children: Not on file  . Years of education: Not on file  . Highest education level: Not on file  Occupational History  . Not on file  Tobacco Use  . Smoking status: Never Smoker  . Smokeless tobacco: Never Used  Substance and Sexual Activity  . Alcohol use: Yes    Comment: socially  . Drug use: No  . Sexual activity: Yes  Other Topics Concern  . Not on file  Social History Narrative   ** Merged History Encounter **       Social Determinants of Health   Financial Resource Strain:   . Difficulty of Paying Living Expenses:   Food Insecurity:   . Worried About Charity fundraiser in the Last Year:   . Arboriculturist in the Last Year:   Transportation Needs:   . Film/video editor (Medical):   Marland Kitchen Lack of Transportation (Non-Medical):   Physical Activity:   . Days of Exercise per  Week:   . Minutes of Exercise per Session:   Stress:   . Feeling of Stress :   Social Connections:   . Frequency of Communication with Friends and Family:   . Frequency of Social Gatherings with Friends and Family:   . Attends Religious Services:   . Active Member of Clubs or Organizations:   . Attends Archivist Meetings:   Marland Kitchen Marital Status:      Observations/Objective: Awake, alert and oriented x 3   Review of Systems  Constitutional: Negative for fever,  malaise/fatigue and weight loss.  HENT: Negative.  Negative for nosebleeds.   Eyes: Negative.  Negative for blurred vision, double vision and photophobia.  Respiratory: Negative.  Negative for cough and shortness of breath.   Cardiovascular: Negative.  Negative for chest pain, palpitations and leg swelling.  Gastrointestinal: Positive for abdominal pain and heartburn. Negative for blood in stool, constipation, diarrhea, melena, nausea and vomiting.  Musculoskeletal: Negative.  Negative for myalgias.  Neurological: Negative.  Negative for dizziness, focal weakness, seizures and headaches.  Psychiatric/Behavioral: Negative.  Negative for suicidal ideas.    Assessment and Plan: Sara [name] was seen today for abdominal pain.  Diagnoses and all orders for this visit:  GERD without esophagitis -     pantoprazole (PROTONIX) 40 MG tablet; Take 1 tablet (40 mg total) by mouth 2 (two) times daily before a meal. -     Ambulatory referral to Gastroenterology -     CMP14+EGFR; Future INSTRUCTIONS: Avoid GERD Triggers: acidic, spicy or fried foods, caffeine, coffee, sodas,  alcohol and chocolate.    Follow Up Instructions Return in about 4 weeks (around 12/01/2019) for GERD, NEEDS ORANGE CARD/CAFA APP.     I discussed the assessment and treatment plan with the patient. The patient was provided an opportunity to ask questions and all were answered. The patient agreed with the plan and demonstrated an understanding of the instructions.   The patient was advised to call back or seek an in-person evaluation if the symptoms worsen or if the condition fails to improve as anticipated.  I provided 13 minutes of non-face-to-face time during this encounter including median intraservice time, reviewing previous notes, labs, imaging, medications and explaining diagnosis and management.  Gildardo Pounds, FNP-BC

## 2019-11-04 ENCOUNTER — Encounter: Payer: Self-pay | Admitting: Gastroenterology

## 2019-11-10 ENCOUNTER — Other Ambulatory Visit: Payer: Self-pay

## 2019-11-10 ENCOUNTER — Ambulatory Visit: Payer: Self-pay | Attending: Nurse Practitioner

## 2019-11-10 DIAGNOSIS — K219 Gastro-esophageal reflux disease without esophagitis: Secondary | ICD-10-CM

## 2019-11-11 LAB — CMP14+EGFR
ALT: 16 IU/L (ref 0–32)
AST: 16 IU/L (ref 0–40)
Albumin/Globulin Ratio: 1.7 (ref 1.2–2.2)
Albumin: 4.3 g/dL (ref 3.8–4.8)
Alkaline Phosphatase: 108 IU/L (ref 39–117)
BUN/Creatinine Ratio: 12 (ref 9–23)
BUN: 8 mg/dL (ref 6–24)
Bilirubin Total: <0.2 mg/dL (ref 0.0–1.2)
CO2: 24 mmol/L (ref 20–29)
Calcium: 9.3 mg/dL (ref 8.7–10.2)
Chloride: 103 mmol/L (ref 96–106)
Creatinine, Ser: 0.65 mg/dL (ref 0.57–1.00)
GFR calc Af Amer: 128 mL/min/{1.73_m2} (ref >59)
GFR calc non Af Amer: 111 mL/min/{1.73_m2} (ref >59)
Globulin, Total: 2.5 g/dL (ref 1.5–4.5)
Glucose: 78 mg/dL (ref 65–99)
Potassium: 4.4 mmol/L (ref 3.5–5.2)
Sodium: 143 mmol/L (ref 134–144)
Total Protein: 6.8 g/dL (ref 6.0–8.5)

## 2019-11-11 MED FILL — ?OMEPRAZOLE 20 MG CAP DR: 20 | 90 days supply | Qty: 180 | Fill #1

## 2019-11-17 ENCOUNTER — Ambulatory Visit: Payer: Self-pay | Admitting: Gastroenterology

## 2019-11-17 ENCOUNTER — Encounter: Payer: Self-pay | Admitting: Gastroenterology

## 2019-11-17 VITALS — BP 112/70 | HR 84 | Temp 97.3°F | Ht 66.0 in | Wt 219.6 lb

## 2019-11-17 DIAGNOSIS — K219 Gastro-esophageal reflux disease without esophagitis: Secondary | ICD-10-CM

## 2019-11-17 DIAGNOSIS — R1011 Right upper quadrant pain: Secondary | ICD-10-CM

## 2019-11-17 DIAGNOSIS — Z01818 Encounter for other preprocedural examination: Secondary | ICD-10-CM

## 2019-11-17 DIAGNOSIS — R1012 Left upper quadrant pain: Secondary | ICD-10-CM

## 2019-11-17 MED ORDER — SUCRALFATE 1 G PO TABS: 1.0000 g | ORAL_TABLET | Freq: Three times a day (TID) | ORAL | 1 refills | Status: DC

## 2019-11-17 MED FILL — ?SUCRALFATE 1 GM TABS: 1 | 30 days supply | Qty: 120 | Fill #0

## 2019-11-17 NOTE — Progress Notes (Signed)
11/17/2019 Sara [name] [id number] Mar 31, 1981   HISTORY OF PRESENT ILLNESS: This is a 39 year old female who is new to our office.  She has been referred here by her PCP, Bertram Denver, NP, for evaluation regarding complaints of acid reflux related issues.  She tells me that she has right upper quadrant abdominal pain and that has been there for years.  It appears that she had an abdominal ultrasound in July 2015 for the same complaint that was unremarkable at that time.  She also describes left upper quadrant abdominal pain that is newer.  She says that it wakes her from sleep at night on occasion and is a burning sensation.  She also reports acid reflux.  She had not been on any acid reflux type medication regimen until November when she was placed on pantoprazole 40 mg twice daily.  She says that it did help some with her acid reflux related issues, but has not helped with her abdominal pain.  She admits to only rare ibuprofen use.  Says her appetite is good.  She denies nausea and vomiting.  She has regular bowel movements.  Patient is primarily Spanish-speaking so the entire visit was performed via interpreter.    Past Medical History:  Diagnosis Date  . GERD (gastroesophageal reflux disease)    Past Surgical History:  Procedure Laterality Date  . CESAREAN SECTION     3x    reports that she has never smoked. She has never used smokeless tobacco. She reports current alcohol use. She reports that she does not use drugs. family history is not on file. No Known Allergies    Outpatient Encounter Medications as of 11/17/2019  Medication Sig  . pantoprazole (PROTONIX) 40 MG tablet Take 1 tablet (40 mg total) by mouth 2 (two) times daily before a meal.   No facility-administered encounter medications on file as of 11/17/2019.     REVIEW OF SYSTEMS  : All other systems reviewed and negative except where noted in the History of Present Illness.   PHYSICAL EXAM: BP 112/70    Pulse 84   Temp (!) 97.3 F (36.3 C)   Ht 5\' 6"  (1.676 m)   Wt 219 lb 9.6 oz (99.6 kg)   BMI 35.44 kg/m  General: Well developed Hispanic female in no acute distress Head: Normocephalic and atraumatic Eyes:  Sclerae anicteric, conjunctiva pink. Ears: Normal auditory acuity Lungs: Clear throughout to auscultation; no increased WOB. Heart: Regular rate and rhythm; no M/R/G. Abdomen: Soft, non-distended.  BS present. Non-tender. Musculoskeletal: Symmetrical with no gross deformities  Skin: No lesions on visible extremities Extremities: No edema  Neurological: Alert oriented x 4, grossly non-focal Psychological:  Alert and cooperative. Normal mood and affect  ASSESSMENT AND PLAN: *39 year old female with complaints of both right upper quadrant and left upper quadrant abdominal pain and acid reflux.  She reports that the right upper quadrant abdominal pain has been present for years.  It appears that she had an unremarkable abdominal ultrasound for this complaint in 2015.  Left upper quadrant abdominal pain is new.  Described as burning.  Acid reflux somewhat improved by PPI therapy, but no change in left upper quadrant pain.  We will plan for EGD for evaluation to rule out reflux related issues, esophagitis, ulcer disease, H. pylori.  I am going to add Carafate ACHS to her medication regimen to see if this offers any additional relief.  Prescription sent to the pharmacy.  Pending results of this may need to  consider repeat abdominal ultrasound and possible HIDA scan to evaluate right upper quadrant abdominal pain.   CC:  Gildardo Pounds, NP

## 2019-11-17 NOTE — Patient Instructions (Addendum)
If you are age 41 or older, your body mass index should be between 23-30. Your Body mass index is 35.44 kg/m. If this is out of the aforementioned range listed, please consider follow up with your Primary Care Provider.  If you are age 40 or younger, your body mass index should be between 19-25. Your Body mass index is 35.44 kg/m. If this is out of the aformentioned range listed, please consider follow up with your Primary Care Provider.   You have been scheduled for an endoscopy. Please follow written instructions given to you at your visit today. If you use inhalers (even only as needed), please bring them with you on the day of your procedure.  We have sent the following medications to your pharmacy for you to pick up at your convenience: Carafate:  disuelva lentamente la tableta en 1 cucharada de agua destilada antes de tomar. Tomar antes de las comidas y antes de Teacher, music   Thank you for entrusting me with your care and for choosing [employer] [employer]

## 2019-11-18 NOTE — Progress Notes (Signed)
____________________________________________________________  Attending physician addendum:  Thank you for sending this case to me. I have reviewed the entire note, and the outlined plan seems appropriate.  Berdell Hostetler Danis, MD  ____________________________________________________________  

## 2019-11-24 ENCOUNTER — Ambulatory Visit (INDEPENDENT_AMBULATORY_CARE_PROVIDER_SITE_OTHER): Payer: Self-pay

## 2019-11-24 ENCOUNTER — Other Ambulatory Visit: Payer: Self-pay | Admitting: Gastroenterology

## 2019-11-24 ENCOUNTER — Other Ambulatory Visit: Payer: Self-pay

## 2019-11-24 DIAGNOSIS — Z1159 Encounter for screening for other viral diseases: Secondary | ICD-10-CM

## 2019-11-25 LAB — SARS CORONAVIRUS 2 (TAT 6-24 HRS): SARS Coronavirus 2: NEGATIVE

## 2019-11-26 ENCOUNTER — Encounter: Payer: Self-pay | Admitting: Gastroenterology

## 2019-11-26 ENCOUNTER — Ambulatory Visit (AMBULATORY_SURGERY_CENTER): Payer: Self-pay | Admitting: Gastroenterology

## 2019-11-26 ENCOUNTER — Other Ambulatory Visit: Payer: Self-pay

## 2019-11-26 VITALS — BP 113/71 | HR 46 | Temp 96.8°F | Resp 15 | Ht 66.0 in | Wt 219.0 lb

## 2019-11-26 DIAGNOSIS — R1011 Right upper quadrant pain: Secondary | ICD-10-CM

## 2019-11-26 DIAGNOSIS — K2950 Unspecified chronic gastritis without bleeding: Secondary | ICD-10-CM

## 2019-11-26 DIAGNOSIS — G8929 Other chronic pain: Secondary | ICD-10-CM

## 2019-11-26 DIAGNOSIS — K3189 Other diseases of stomach and duodenum: Secondary | ICD-10-CM

## 2019-11-26 DIAGNOSIS — R12 Heartburn: Secondary | ICD-10-CM

## 2019-11-26 MED ORDER — SODIUM CHLORIDE 0.9 % IV SOLN: 500.0000 mL | Freq: Once | INTRAVENOUS | Status: DC

## 2019-11-26 NOTE — Patient Instructions (Signed)
Resume previous diet Continue current medications Await pathology results  YOU HAD AN ENDOSCOPIC PROCEDURE TODAY AT THE [facility]:   Refer to the procedure report that was given to you for any specific questions about what was found during the examination.  If the procedure report does not answer your questions, please call your gastroenterologist to clarify.  If you requested that your care partner not be given the details of your procedure findings, then the procedure report has been included in a sealed envelope for you to review at your convenience later.  YOU SHOULD EXPECT: Some feelings of bloating in the abdomen. Passage of more gas than usual.  Walking can help get rid of the air that was put into your GI tract during the procedure and reduce the bloating. If you had a lower endoscopy (such as a colonoscopy or flexible sigmoidoscopy) you may notice spotting of blood in your stool or on the toilet paper. If you underwent a bowel prep for your procedure, you may not have a normal bowel movement for a few days.  Please Note:  You might notice some irritation and congestion in your nose or some drainage.  This is from the oxygen used during your procedure.  There is no need for concern and it should clear up in a day or so.  SYMPTOMS TO REPORT IMMEDIATELY:   Following upper endoscopy (EGD)  Vomiting of blood or coffee ground material  New chest pain or pain under the shoulder blades  Painful or persistently difficult swallowing  New shortness of breath  Fever of 100F or higher  Black, tarry-looking stools  For urgent or emergent issues, a gastroenterologist can be reached at any hour by calling [phone]. Do not use MyChart messaging for urgent concerns.    DIET:  We do recommend a small meal at first, but then you may proceed to your regular diet.  Drink plenty of fluids but you should avoid alcoholic beverages for 24 hours.  ACTIVITY:  You should plan to take it  easy for the rest of today and you should NOT DRIVE or use heavy machinery until tomorrow (because of the sedation medicines used during the test).    FOLLOW UP: Our staff will call the number listed on your records 48-72 hours following your procedure to check on you and address any questions or concerns that you may have regarding the information given to you following your procedure. If we do not reach you, we will leave a message.  We will attempt to reach you two times.  During this call, we will ask if you have developed any symptoms of COVID 19. If you develop any symptoms (ie: fever, flu-like symptoms, shortness of breath, cough etc.) before then, please call [phone].  If you test positive for Covid 19 in the 2 weeks post procedure, please call and report this information to us.    If any biopsies were taken you will be contacted by phone or by letter within the next 1-3 weeks.  Please call us at [phone] if you have not heard about the biopsies in 3 weeks.    SIGNATURES/CONFIDENTIALITY: You and/or your care partner have signed paperwork which will be entered into your electronic medical record.  These signatures attest to the fact that that the information above on your After Visit Summary has been reviewed and is understood.  Full responsibility of the confidentiality of this discharge information lies with you and/or your care-partner. 

## 2019-11-26 NOTE — Progress Notes (Signed)
Report given to PACU, vss 

## 2019-11-26 NOTE — Progress Notes (Signed)
Pt's states no medical or surgical changes since previsit or office visit.  SH Iv, JB temp and SM vitals.

## 2019-11-26 NOTE — Progress Notes (Signed)
Assistance with interpreting by Raynelle Fanning.

## 2019-11-26 NOTE — Op Note (Signed)
[facility] Patient Name: Sara [name] Procedure Date: 11/26/2019 10:18 AM MRN: [id number] Endoscopist: Sara [name] , MD Age: 39 Referring MD:  Date of Birth: [birth year] Gender: Female Account #: [id number] Procedure:                Upper GI endoscopy Indications:              Epigastric abdominal pain, Abdominal pain in the                            right upper quadrant, Heartburn Medicines:                Monitored Anesthesia Care Procedure:                Pre-Anesthesia Assessment:                           - Prior to the procedure, a History and Physical                            was performed, and patient medications and                            allergies were reviewed. The patient's tolerance of                            previous anesthesia was also reviewed. The risks                            and benefits of the procedure and the sedation                            options and risks were discussed with the patient.                            All questions were answered, and informed consent                            was obtained. Prior Anticoagulants: The patient has                            taken no previous anticoagulant or antiplatelet                            agents. ASA Grade Assessment: II - A patient with                            mild systemic disease. After reviewing the risks                            and benefits, the patient was deemed in                            satisfactory condition to undergo the procedure.  After obtaining informed consent, the endoscope was                            passed under direct vision. Throughout the                            procedure, the patient's blood pressure, pulse, and                            oxygen saturations were monitored continuously. The                            Endoscope was introduced through the mouth, and                            advanced to the  second part of duodenum. The upper                            GI endoscopy was accomplished without difficulty.                            The patient tolerated the procedure well. Scope In: Scope Out: Findings:                 The larynx was normal.                           The esophagus was normal.                           The entire examined stomach was normal. Biopsies                            were taken with a cold forceps for histology.                            ([place] protocol).                           The examined duodenum was normal. Complications:            No immediate complications. Estimated Blood Loss:     Estimated blood loss was minimal. Impression:               - Normal larynx.                           - Normal esophagus.                           - Normal stomach. Biopsied.                           - Normal examined duodenum. Recommendation:           - Patient has a contact number available for  emergencies. The signs and symptoms of potential                            delayed complications were discussed with the                            patient. Return to normal activities tomorrow.                            Written discharge instructions were provided to the                            patient.                           - Resume previous diet.                           - Continue present medications.                           - Await pathology results. Sara Frane L. Loletha Carrow, MD 11/26/2019 10:37:58 AM This report has been signed electronically.

## 2019-11-29 ENCOUNTER — Other Ambulatory Visit: Payer: Self-pay

## 2019-11-29 ENCOUNTER — Ambulatory Visit: Payer: Self-pay

## 2019-11-30 ENCOUNTER — Encounter: Payer: Self-pay | Admitting: Gastroenterology

## 2019-11-30 ENCOUNTER — Telehealth: Payer: Self-pay | Admitting: *Deleted

## 2019-11-30 NOTE — Telephone Encounter (Signed)
  Follow up Call-  Call back number 11/26/2019  Post procedure Call Back phone  # [phone]  Permission to leave phone message Yes  Some recent data might be hidden     Patient questions:  Do you have a fever, pain , or abdominal swelling? No. Pain Score  0 *  Have you tolerated food without any problems? Yes.    Have you been able to return to your normal activities? Yes.    Do you have any questions about your discharge instructions: Diet   No. Medications  No. Follow up visit  No.  Do you have questions or concerns about your Care? No.  Actions: * If pain score is 4 or above: No action needed, pain <4.  1. Have you developed a fever since your procedure? no  2.   Have you had an respiratory symptoms (SOB or cough) since your procedure? no  3.   Have you tested positive for COVID 19 since your procedure no  4.   Have you had any family members/close contacts diagnosed with the COVID 19 since your procedure?  no   If yes to any of these questions please route to Laverna Peace, RN and Charlett Lango, RN

## 2019-12-14 ENCOUNTER — Ambulatory Visit: Payer: Self-pay | Attending: Nurse Practitioner | Admitting: Nurse Practitioner

## 2019-12-14 ENCOUNTER — Other Ambulatory Visit: Payer: Self-pay

## 2019-12-14 ENCOUNTER — Encounter: Payer: Self-pay | Admitting: Nurse Practitioner

## 2019-12-14 DIAGNOSIS — K219 Gastro-esophageal reflux disease without esophagitis: Secondary | ICD-10-CM

## 2019-12-14 DIAGNOSIS — R1011 Right upper quadrant pain: Secondary | ICD-10-CM

## 2019-12-14 NOTE — Progress Notes (Signed)
Virtual Visit via Telephone Note Due to national recommendations of social distancing due to COVID 19, telehealth visit is felt to be most appropriate for this patient at this time.  I discussed the limitations, risks, security and privacy concerns of performing an evaluation and management service by telephone and the availability of in person appointments. I also discussed with the patient that there may be a patient responsible charge related to this service. The patient expressed understanding and agreed to proceed.    I connected with Sara [name] on 12/14/19  at   3:10 PM EDT  EDT by telephone and verified that I am speaking with the correct person using two identifiers.   Consent I discussed the limitations, risks, security and privacy concerns of performing an evaluation and management service by telephone and the availability of in person appointments. I also discussed with the patient that there may be a patient responsible charge related to this service. The patient expressed understanding and agreed to proceed.   Location of Patient: Private Residence    Location of Provider: [facility]    Persons participating in Telemedicine visit: Sara Denver FNP-BC [employer] CMA Marae [name]  Spanish interpreter was used during this telemetry visit today.   History of Present Illness: Telemedicine visit for: RUQ PAIN  She has been referred to GI for this however unfortunately due to miscommunication she assumed she was to follow-up with me if her symptoms continue.  She was also under the impression that she had been previously referred to GI for left quadrant abdominal pain and not right upper quadrant abdominal pain.  I have instructed her that she was referred to GI for both and the endoscopy was normal as she had been instructed via letter that was mailed to her to follow-up with GI if symptoms persisted.  Despite trying her on omeprazole  and pantoprazole her right upper quadrant pain and heartburn symptoms have persisted.  She had an endoscopy a few weeks ago. Symptoms include epigastric pain, right upper quadrant pain which she describes as burning.Shedenies choking on food, fullness after meals, hematemesis, melena, odynophagia and regurgitation of undigested food.Shedenies dysphagia.Shehas not lost weight.Shedenies melena, hematochezia, hematemesis, and coffee ground emesis.  Cervical pathology did not reveal any H. pylori.  She also had a negative H. pylori breath test April 12, 2019.  Past Medical History:  Diagnosis Date  . GERD (gastroesophageal reflux disease)     Past Surgical History:  Procedure Laterality Date  . CESAREAN SECTION     3x    Family History  Problem Relation Age of Onset  . Diabetes Neg Hx   . Hypertension Neg Hx   . Colon cancer Neg Hx   . Esophageal cancer Neg Hx   . Pancreatic cancer Neg Hx   . Stomach cancer Neg Hx   . Liver disease Neg Hx     Social History   Socioeconomic History  . Marital status: Married    Spouse name: Not on file  . Number of children: Not on file  . Years of education: Not on file  . Highest education level: Not on file  Occupational History  . Not on file  Tobacco Use  . Smoking status: Never Smoker  . Smokeless tobacco: Never Used  Substance and Sexual Activity  . Alcohol use: Yes    Comment: socially  . Drug use: No  . Sexual activity: Yes  Other Topics Concern  . Not on file  Social History  Narrative   ** Merged History Encounter **       Social Determinants of Health   Financial Resource Strain:   . Difficulty of Paying Living Expenses:   Food Insecurity:   . Worried About Charity fundraiser in the Last Year:   . Arboriculturist in the Last Year:   Transportation Needs:   . Film/video editor (Medical):   Marland Kitchen Lack of Transportation (Non-Medical):   Physical Activity:   . Days of Exercise per Week:   . Minutes of Exercise  per Session:   Stress:   . Feeling of Stress :   Social Connections:   . Frequency of Communication with Friends and Family:   . Frequency of Social Gatherings with Friends and Family:   . Attends Religious Services:   . Active Member of Clubs or Organizations:   . Attends Archivist Meetings:   Marland Kitchen Marital Status:      Observations/Objective: Awake, alert and oriented x 3   Review of Systems  Constitutional: Negative for fever, malaise/fatigue and weight loss.  HENT: Negative.  Negative for nosebleeds.   Eyes: Negative.  Negative for blurred vision, double vision and photophobia.  Respiratory: Negative.  Negative for cough and shortness of breath.   Cardiovascular: Negative.  Negative for chest pain, palpitations and leg swelling.  Gastrointestinal: Positive for abdominal pain (Right upper quadrant). Negative for heartburn, nausea and vomiting.  Musculoskeletal: Negative.  Negative for myalgias.  Neurological: Negative.  Negative for dizziness, focal weakness, seizures and headaches.  Psychiatric/Behavioral: Negative.  Negative for suicidal ideas.    Assessment and Plan: [name] was seen today for pain.  Diagnoses and all orders for this visit:  Abdominal pain, right upper quadrant Patient instructed to follow-up with gastroenterology as PPIs have been ineffective at this time and she recently had an endoscopy performed.  Gastroesophageal reflux disease, unspecified whether esophagitis present INSTRUCTIONS: Avoid GERD Triggers: acidic, spicy or fried foods, caffeine, coffee, sodas,  alcohol and chocolate.     Follow Up Instructions Return if symptoms worsen or fail to improve.     I discussed the assessment and treatment plan with the patient. The patient was provided an opportunity to ask questions and all were answered. The patient agreed with the plan and demonstrated an understanding of the instructions.   The patient was advised to call back or seek an  in-person evaluation if the symptoms worsen or if the condition fails to improve as anticipated.  I provided 14 minutes of non-face-to-face time during this encounter including median intraservice time, reviewing previous notes, labs, imaging, medications and explaining diagnosis and management.  Gildardo Pounds, FNP-BC

## 2020-01-26 ENCOUNTER — Encounter: Payer: Self-pay | Admitting: Gastroenterology

## 2020-01-26 ENCOUNTER — Ambulatory Visit (INDEPENDENT_AMBULATORY_CARE_PROVIDER_SITE_OTHER): Payer: Self-pay | Admitting: Gastroenterology

## 2020-01-26 VITALS — BP 110/70 | HR 68 | Ht 66.0 in | Wt 217.0 lb

## 2020-01-26 DIAGNOSIS — R1011 Right upper quadrant pain: Secondary | ICD-10-CM

## 2020-01-26 DIAGNOSIS — R1012 Left upper quadrant pain: Secondary | ICD-10-CM

## 2020-01-26 DIAGNOSIS — K219 Gastro-esophageal reflux disease without esophagitis: Secondary | ICD-10-CM

## 2020-01-26 MED ORDER — PANTOPRAZOLE SODIUM 40 MG PO TBEC: 40.0000 mg | DELAYED_RELEASE_TABLET | Freq: Two times a day (BID) | ORAL | 5 refills | Status: DC

## 2020-01-26 MED FILL — ?PANTOPRAZOLE SO DR 40MG TA: 40 | 30 days supply | Qty: 60 | Fill #0

## 2020-01-26 NOTE — Patient Instructions (Addendum)
If you are age 41 or older, your body mass index should be between 23-30. Your Body mass index is 35.02 kg/m. If this is out of the aforementioned range listed, please consider follow up with your Primary Care Provider.  If you are age 86 or younger, your body mass index should be between 19-25. Your Body mass index is 35.02 kg/m. If this is out of the aformentioned range listed, please consider follow up with your Primary Care Provider.   You have been scheduled for a CT scan of the abdomen and pelvis at [facility], 1st floor Radiology.  You are scheduled on Tuesday 02/01/20 at 3:30 pm. You should arrive 15 minutes prior to your appointment time for registration.   Please go to [facility] at least 3 days prior to your procedure to pick up instructions and contrast for the exam.  WARNING: IF YOU ARE ALLERGIC TO IODINE/X-RAY DYE, PLEASE NOTIFY RADIOLOGY IMMEDIATELY AT [phone]! YOU WILL BE GIVEN A 13 HOUR PREMEDICATION PREP.   If you have any questions regarding your exam or if you need to reschedule, you may call [phone] between the hours of 8:00 am and 5:00 pm, Monday-Friday.  _________________________________________________________________

## 2020-01-26 NOTE — Progress Notes (Signed)
01/26/2020 Sara [name] [id number] 05/12/1981   HISTORY OF PRESENT ILLNESS: This is a 39 year old female who is now a patient of Dr. Irving Burton.  She is here for follow-up of her complaints of upper abdominal pain.  She was seen here on April 7 by me and was scheduled for an EGD.  EGD was normal and gastric biopsy showed mild chronic gastritis and mild reactive gastropathy, negative for Hpylori.  She continues on pantoprazole 40 mg twice daily, which she feels controls her acid reflux well.  Nonetheless, she still complains of both right upper quadrant and left upper quadrant abdominal pains.  As stated previously it looks like the right upper quadrant abdominal pain has been present for years and she had an abdominal ultrasound in July 2015 for the same complaint that was unremarkable.  She still reports bilateral pain intermittently.  She says that she usually gets pain every other day.  Pain comes and goes, occurs after eating.  Much more significant on the right side, however.  No nausea or vomiting.  Patient is primarily Spanish-speaking so the entire interview was performed via interpreter/translator.   Past Medical History:  Diagnosis Date  . GERD (gastroesophageal reflux disease)    Past Surgical History:  Procedure Laterality Date  . CESAREAN SECTION     3x    reports that she has never smoked. She has never used smokeless tobacco. She reports current alcohol use. She reports that she does not use drugs. family history is not on file. No Known Allergies    Outpatient Encounter Medications as of 01/26/2020  Medication Sig  . pantoprazole (PROTONIX) 40 MG tablet Take 1 tablet (40 mg total) by mouth 2 (two) times daily before a meal.  . [DISCONTINUED] sucralfate (CARAFATE) 1 g tablet Take 1 tablet (1 g total) by mouth 4 (four) times daily -  with meals and at bedtime. Slowly dissolve tablet in 1 tablespoon of distilled water before taking   No facility-administered  encounter medications on file as of 01/26/2020.     REVIEW OF SYSTEMS  : All other systems reviewed and negative except where noted in the History of Present Illness.   PHYSICAL EXAM: BP 110/70   Pulse 68   Ht 5\' 6"  (1.676 m)   Wt 217 lb (98.4 kg)   LMP 01/26/2020 (Approximate)   BMI 35.02 kg/m  General: Well developed Hispanic female in no acute distress Head: Normocephalic and atraumatic Eyes:  Sclerae anicteric, conjunctiva pink. Ears: Normal auditory acuity Lungs: Clear throughout to auscultation; no increased WOB. Heart: Regular rate and rhythm; no M/R/G. Abdomen: Soft, non-distended.  BS present.  Non-tender. Musculoskeletal: Symmetrical with no gross deformities  Skin: No lesions on visible extremities Extremities: No edema  Neurological: Alert oriented x 4, grossly non-focal Psychological:  Alert and cooperative. Normal mood and affect  ASSESSMENT AND PLAN: *39 year-old female with complaints of both right and left upper quadrant abdominal pains and acid reflux.  Right upper quadrant abdominal pain has been present for years and she had an unremarkable abdominal ultrasound for this complaint in 2015.  EGD recently was normal.  Acid reflux seems to be well-controlled on pantoprazole 40 mg twice daily.  She continues with abdominal pain, however.  Pain in both areas, but more prominent on the right.  Symptoms of right-sided abdominal pain radiates around to her right back.  Pain comes after eating.  We will plan for CT scan of the abdomen with contrast.  If unremarkable and pain  continues then we will proceed with HIDA scan to rule out biliary dyskinesia. *GERD: Well-controlled on pantoprazole 40 mg twice daily.  We will continue that for now.  New prescription sent.   CC:  Gildardo Pounds, NP

## 2020-01-27 NOTE — Addendum Note (Signed)
Addended by: Mariane Duval on: 01/27/2020 08:48 AM   Modules accepted: Orders

## 2020-01-27 NOTE — Progress Notes (Signed)
____________________________________________________________  Attending physician addendum:  Thank you for sending this case to me. I have reviewed the entire note.  I agree with the CT scan.  However, if unremarkable, I would not pursue HIDA scan b/c pain not typical for biliary colic.   Amada Jupiter, MD  ____________________________________________________________

## 2020-02-01 ENCOUNTER — Ambulatory Visit (HOSPITAL_COMMUNITY)
Admission: RE | Admit: 2020-02-01 | Discharge: 2020-02-01 | Disposition: A | Discharge status: Home / Self Care | Payer: Self-pay | Source: Ambulatory Visit | Attending: Gastroenterology | Admitting: Gastroenterology

## 2020-02-01 ENCOUNTER — Other Ambulatory Visit: Payer: Self-pay

## 2020-02-01 DIAGNOSIS — R1012 Left upper quadrant pain: Secondary | ICD-10-CM

## 2020-02-01 DIAGNOSIS — R1011 Right upper quadrant pain: Secondary | ICD-10-CM

## 2020-02-01 MED ORDER — SODIUM CHLORIDE (PF) 0.9 % IJ SOLN
INTRAMUSCULAR | Status: AC
  Filled 2020-02-01: qty 50

## 2020-02-01 MED ORDER — IOHEXOL 300 MG/ML  SOLN
100.0000 mL | Freq: Once | INTRAMUSCULAR | Status: AC | PRN
  Administered 2020-02-01: 100 mL via INTRAVENOUS

## 2020-02-02 ENCOUNTER — Telehealth: Payer: Self-pay | Admitting: Gastroenterology

## 2020-02-02 NOTE — Telephone Encounter (Signed)
Call report recieved from radiology--Jessica please see the CT report from today

## 2020-02-02 NOTE — Telephone Encounter (Signed)
[facility] radiology called for a call report on CT abd.

## 2020-02-03 ENCOUNTER — Other Ambulatory Visit: Payer: Self-pay

## 2020-02-03 DIAGNOSIS — R932 Abnormal findings on diagnostic imaging of liver and biliary tract: Secondary | ICD-10-CM

## 2020-02-03 DIAGNOSIS — R1012 Left upper quadrant pain: Secondary | ICD-10-CM

## 2020-02-03 DIAGNOSIS — R1011 Right upper quadrant pain: Secondary | ICD-10-CM

## 2020-02-03 NOTE — Progress Notes (Signed)
Mr

## 2020-02-08 ENCOUNTER — Ambulatory Visit: Payer: Self-pay | Admitting: Gastroenterology

## 2020-02-23 ENCOUNTER — Ambulatory Visit (HOSPITAL_COMMUNITY)
Admission: RE | Admit: 2020-02-23 | Discharge: 2020-02-23 | Disposition: A | Discharge status: Home / Self Care | Payer: Self-pay | Source: Ambulatory Visit | Attending: Gastroenterology | Admitting: Gastroenterology

## 2020-02-23 ENCOUNTER — Ambulatory Visit (HOSPITAL_COMMUNITY): Payer: Self-pay

## 2020-02-23 ENCOUNTER — Other Ambulatory Visit: Payer: Self-pay

## 2020-02-23 DIAGNOSIS — R1011 Right upper quadrant pain: Secondary | ICD-10-CM | POA: Insufficient documentation

## 2020-02-23 DIAGNOSIS — R932 Abnormal findings on diagnostic imaging of liver and biliary tract: Secondary | ICD-10-CM | POA: Insufficient documentation

## 2020-02-23 DIAGNOSIS — R1012 Left upper quadrant pain: Secondary | ICD-10-CM | POA: Insufficient documentation

## 2020-02-23 MED ORDER — GADOBUTROL 1 MMOL/ML IV SOLN
10.0000 mL | Freq: Once | INTRAVENOUS | Status: AC | PRN
  Administered 2020-02-23: 10 mL via INTRAVENOUS

## 2020-03-21 ENCOUNTER — Ambulatory Visit: Payer: Self-pay | Admitting: Gastroenterology

## 2020-04-18 ENCOUNTER — Encounter: Payer: Self-pay | Admitting: Gastroenterology

## 2020-04-18 ENCOUNTER — Ambulatory Visit (INDEPENDENT_AMBULATORY_CARE_PROVIDER_SITE_OTHER): Payer: Self-pay | Admitting: Gastroenterology

## 2020-04-18 ENCOUNTER — Other Ambulatory Visit: Payer: Self-pay | Admitting: Gastroenterology

## 2020-04-18 VITALS — BP 108/64 | HR 64 | Ht 66.0 in | Wt 215.6 lb

## 2020-04-18 DIAGNOSIS — K219 Gastro-esophageal reflux disease without esophagitis: Secondary | ICD-10-CM

## 2020-04-18 DIAGNOSIS — R1012 Left upper quadrant pain: Secondary | ICD-10-CM

## 2020-04-18 DIAGNOSIS — R1011 Right upper quadrant pain: Secondary | ICD-10-CM

## 2020-04-18 DIAGNOSIS — R12 Heartburn: Secondary | ICD-10-CM

## 2020-04-18 MED ORDER — PANTOPRAZOLE SODIUM 40 MG PO TBEC: 40.0000 mg | DELAYED_RELEASE_TABLET | Freq: Every day | ORAL | 5 refills | Status: DC

## 2020-04-18 MED FILL — PANTOPRAZOLE SOD DR 40 MG T: 40 | 30 days supply | Qty: 30 | Fill #0

## 2020-04-18 NOTE — Patient Instructions (Signed)
If you are age 41 or older, your body mass index should be between 23-30. Your Body mass index is 34.8 kg/m. If this is out of the aforementioned range listed, please consider follow up with your Primary Care Provider.  If you are age 59 or younger, your body mass index should be between 19-25. Your Body mass index is 34.8 kg/m. If this is out of the aformentioned range listed, please consider follow up with your Primary Care Provider.   Please purchase some over the counter patches ( Lidocaine patches) use the patches at the bottom of the rib cage, use naproxen for upper abdominal pain.  It was a pleasure to see you today!  Dr. Myrtie Neither

## 2020-04-18 NOTE — Progress Notes (Signed)
[facility] Progress Note  Chief Complaint: Right upper quadrant pain  Subjective  History: 11/17/2019 office note reviewed.  Seen by our PA for years of right upper quadrant pain at least as far back as 2015.  The pain was involving to be in the left upper quadrant as well and is of a burning quality.  Little or no improvement in the abdominal pain with PPI, though it may have helped some heartburn.  EGD 11/26/2019 was normal, and biopsy negative for H. Pylori. Clinic visit June 16 similar symptoms, CT scan abdomen ordered.  Sara [name] was seen with a Spanish interpreter.  She describes bilateral upper quadrant abdominal pain but more on the right.  It is constant, worse if there is pressure such as a tight shirt or laying on that side.  It bothered her more severely for about a week before the most recent clinic visit and subsided since then.  It is not worse with deep breaths, and does not have associated nausea vomiting or altered bowel habits.  She has intermittent heartburn triggered by certain foods.  Denies dysphagia or odynophagia.  ROS: Cardiovascular:  no chest pain Respiratory: no dyspnea Remainder of systems negative except as above The patient's Past Medical, Family and Social History were reviewed and are on file in the EMR.  Objective:  Med list reviewed  Current Outpatient Medications:  .  pantoprazole (PROTONIX) 40 MG tablet, Take 1 tablet (40 mg total) by mouth 2 (two) times daily before a meal., Disp: 60 tablet, Rfl: 5   Vital signs in last 24 hrs: Vitals:   04/18/20 1606  BP: 108/64  Pulse: 64    Physical Exam Female interpreter present for entire visit Well-appearing  HEENT: sclera anicteric, oral mucosa moist without lesions  Neck: supple, no thyromegaly, JVD or lymphadenopathy  Cardiac: RRR without murmurs, S1S2 heard, no peripheral edema  Pulm: clear to auscultation bilaterally, normal RR and effort noted  Abdomen: soft, mild tenderness lower  aspect of both anterior midline rib cages without overlying skin changes or palpable fullness/mass, with active bowel sounds. No guarding or palpable hepatosplenomegaly.  Skin; warm and dry, no jaundice or rash  Labs:   ___________________________________________ Radiologic studies:  CLINICAL DATA:  Upper abdominal pain for 1 year.   EXAM: CT ABDOMEN WITH CONTRAST   TECHNIQUE: Multidetector CT imaging of the abdomen was performed using the standard protocol following bolus administration of intravenous contrast.   CONTRAST:  OMNIPAQUE IOHEXOL 300 MG/ML  SOLN   COMPARISON:  None.   FINDINGS: Lower chest: Unremarkable.   Hepatobiliary: Focal hypo attenuation in the medial segment left liver at the falciform ligament is in a characteristic location for focal fatty deposition but is more prominent than typically seen in this etiology. Liver parenchyma otherwise unremarkable. There is no evidence for gallstones, gallbladder wall thickening, or pericholecystic fluid. No intrahepatic or extrahepatic biliary dilation.   Pancreas: No focal mass lesion. No dilatation of the main duct. No intraparenchymal cyst. No peripancreatic edema.   Spleen: No splenomegaly. No focal mass lesion.   Adrenals/Urinary Tract: No adrenal nodule or mass. Kidneys unremarkable.   Stomach/Bowel: Stomach is unremarkable. No gastric wall thickening. No evidence of outlet obstruction. Duodenum is normally positioned as is the ligament of Treitz. No small bowel or colonic dilatation within the visualized abdomen.   Vascular/Lymphatic: No abdominal aortic aneurysm. No abdominal aortic atherosclerotic calcification. There is no gastrohepatic or hepatoduodenal ligament lymphadenopathy. No retroperitoneal or mesenteric lymphadenopathy.   Other: No  intraperitoneal free fluid.   Musculoskeletal: No worrisome lytic or sclerotic osseous abnormality.   IMPRESSION: Ill-defined relatively focal low  attenuation identified in the medial segment left liver at the falciform ligament. While this is a characteristic location for focal fatty deposition, the appearance is more prominent than typically seen. Given the prominence and patient's history of upper abdominal pain, MRI of the abdomen with and without contrast recommended to confirm that this is focal fat.   These results will be called to the ordering clinician or representative by the Radiologist Assistant, and communication documented in the PACS or [employer].     Electronically Signed   By: Kennith Center M.D.   On: 02/02/2020 10:42 ___________________________________  CLINICAL DATA:  Upper abdominal pain. Indeterminate hepatic lesion on CT. MRI recommended for further evaluation.   EXAM: MRI ABDOMEN WITHOUT AND WITH CONTRAST   TECHNIQUE: Multiplanar multisequence MR imaging of the abdomen was performed both before and after the administration of intravenous contrast.   CONTRAST:  39mL GADAVIST GADOBUTROL 1 MMOL/ML IV SOLN   COMPARISON:  Upper abdominal pain   FINDINGS: Lower chest:  Lung bases are clear.   Hepatobiliary: Attention is directed to the falciform ligament region of the liver where low-attenuation lesion was present on comparison CT. There is no clear lesion identified on the noncontrast pulse sequences or postcontrast imaging.   Normal hepatic parenchyma.  No biliary ductal dilatation.   Gallbladder and biliary tree are normal.   Pancreas: Normal pancreatic parenchymal intensity. No ductal dilatation or inflammation.   Spleen: Normal spleen.   Adrenals/urinary tract: Adrenal glands and kidneys are normal.   Stomach/Bowel: Stomach and limited of the small bowel is unremarkable   Vascular/Lymphatic: Abdominal aortic normal caliber. No retroperitoneal periportal lymphadenopathy.   Musculoskeletal: No aggressive osseous lesion   IMPRESSION: 1. No lesion identified within the liver  to correspond to the abnormality on comparison CT. Favor CT abnormality to represent focal fatty infiltration occult by MRI imaging. No specific follow-up recommended. 2. Normal biliary tree and gallbladder. 3. Normal pancreas.     Electronically Signed   By: Genevive Bi M.D.   On: 02/23/2020 09:49    ____________________________________________ Other:   _____________________________________________ Assessment & Plan  Assessment: Encounter Diagnoses  Name Primary?  . Abdominal wall pain in both upper quadrants Yes  . Heartburn    Episodic heartburn, better with PPI, triggered by certain foods.  Reviewed the necessary diet and lifestyle measures to control reflux as well as limitations of acid suppression medicine.  Her upper abdominal pain has been difficult to characterize, present for many years, now has more musculoskeletal sound to it.  She has had a thorough work-up with no discernible upper digestive cause for her symptoms.  I do not think this is biliary colic.   Plan: Recommended some anti-inflammatory therapy with some lidocaine patches and Naprosyn and reevaluation by primary care. Reassured her about the radiographic findings, since she was very concerned when she heard that there was fatty liver.  It was a mild focal finding on CT scan.  Follow-up as needed.  PPI refilled.  30 minutes were spent on this encounter (including chart review, history/exam, counseling/coordination of care, and documentation)  Charlie Pitter III

## 2020-04-26 ENCOUNTER — Telehealth: Payer: Self-pay | Admitting: Nurse Practitioner

## 2020-04-26 NOTE — Telephone Encounter (Signed)
Pt was call and inform that her CAFA is active until 05/24/20 that is too soon to reapply

## 2020-04-26 NOTE — Telephone Encounter (Signed)
Please advise.   Copied from CRM [id number]. Topic: General - Other >> Apr 26, 2020 11:30 AM Jaquita Rector A wrote: Reason for CRM: Patient called to inform Dr Meredeth Ide that she is having pain in her back leading to her abdomen and down her legs. Appointment scheduled for 05/29/20 first available. Also patient would like a call back to schedule an appointment for recertification Ph# [phone]

## 2020-04-26 NOTE — Telephone Encounter (Signed)
I believe she would like an appt. To reapply for CAFA.

## 2020-05-29 ENCOUNTER — Other Ambulatory Visit: Payer: Self-pay

## 2020-05-29 ENCOUNTER — Ambulatory Visit: Payer: Self-pay | Attending: Nurse Practitioner | Admitting: Nurse Practitioner

## 2020-05-29 ENCOUNTER — Encounter: Payer: Self-pay | Admitting: Nurse Practitioner

## 2020-05-29 ENCOUNTER — Other Ambulatory Visit: Payer: Self-pay | Admitting: Nurse Practitioner

## 2020-05-29 VITALS — BP 109/74 | HR 84 | Temp 97.7°F | Ht 66.0 in | Wt 215.0 lb

## 2020-05-29 DIAGNOSIS — G8929 Other chronic pain: Secondary | ICD-10-CM

## 2020-05-29 DIAGNOSIS — K219 Gastro-esophageal reflux disease without esophagitis: Secondary | ICD-10-CM

## 2020-05-29 DIAGNOSIS — Z1231 Encounter for screening mammogram for malignant neoplasm of breast: Secondary | ICD-10-CM

## 2020-05-29 DIAGNOSIS — Z1159 Encounter for screening for other viral diseases: Secondary | ICD-10-CM

## 2020-05-29 DIAGNOSIS — Z0000 Encounter for general adult medical examination without abnormal findings: Secondary | ICD-10-CM

## 2020-05-29 DIAGNOSIS — M545 Low back pain, unspecified: Secondary | ICD-10-CM

## 2020-05-29 DIAGNOSIS — D649 Anemia, unspecified: Secondary | ICD-10-CM

## 2020-05-29 LAB — POCT URINALYSIS DIP (CLINITEK)
Bilirubin, UA: NEGATIVE
Glucose, UA: NEGATIVE mg/dL
Ketones, POC UA: NEGATIVE mg/dL
Leukocytes, UA: NEGATIVE
Nitrite, UA: NEGATIVE
POC PROTEIN,UA: NEGATIVE
Spec Grav, UA: 1.025 (ref 1.010–1.025)
Urobilinogen, UA: 0.2 E.U./dL
pH, UA: 5.5 (ref 5.0–8.0)

## 2020-05-29 MED ORDER — LIDOCAINE 5 % EX PTCH: 1.0000 | MEDICATED_PATCH | CUTANEOUS | 1 refills | Status: DC

## 2020-05-29 MED ORDER — TIZANIDINE HCL 4 MG PO CAPS: 4.0000 mg | ORAL_CAPSULE | Freq: Three times a day (TID) | ORAL | 1 refills | Status: AC | PRN

## 2020-05-29 MED ORDER — PANTOPRAZOLE SODIUM 40 MG PO TBEC: 40.0000 mg | DELAYED_RELEASE_TABLET | Freq: Every day | ORAL | 1 refills | Status: DC

## 2020-05-29 MED FILL — tiZANidine HCL 4 MG TABS: 4 | 20 days supply | Qty: 60 | Fill #0

## 2020-05-29 MED FILL — ?LIDOCAINE 5% PATCH: 5 | 30 days supply | Qty: 30 | Fill #0

## 2020-05-29 MED FILL — PANTOPRAZOLE SOD DR 40 MG T: 40 | 30 days supply | Qty: 30 | Fill #0

## 2020-05-29 NOTE — Progress Notes (Signed)
Assessment & Plan:  Sara [name] was seen today for annual exam.  Diagnoses and all orders for this visit:  Encounter for annual physical exam -     Basic metabolic panel -     Lipid panel -     Hemoglobin A1c  GERD without esophagitis -     pantoprazole (PROTONIX) 40 MG tablet; Take 1 tablet (40 mg total) by mouth daily. INSTRUCTIONS: Avoid GERD Triggers: acidic, spicy or fried foods, caffeine, coffee, sodas,  alcohol and chocolate.   Chronic midline low back pain without sciatica -     POCT URINALYSIS DIP (CLINITEK) -     lidocaine (LIDODERM) 5 %; Place 1 patch onto the skin daily. Place on back -     tiZANidine (ZANAFLEX) 4 MG capsule; Take 1 capsule (4 mg total) by mouth 3 (three) times daily as needed for muscle spasms. Work on losing weight to help reduce back pain. May alternate with heat and ice application for pain relief. May also alternate with acetaminophen and Ibuprofen as prescribed for back pain. Other alternatives include massage, acupuncture and water aerobics.  You must stay active and avoid a sedentary lifestyle.    Need for hepatitis C screening test -     Hepatitis C Antibody  Normocytic anemia -     CBC  Breast cancer screening by mammogram -     MM 3D SCREEN BREAST BILATERAL; Future    Patient has been counseled on age-appropriate routine health concerns for screening and prevention. These are reviewed and up-to-date. Referrals have been placed accordingly. Immunizations are up-to-date or declined.    Subjective:   Chief Complaint  Patient presents with  . Annual Exam   HPI Sara [name] 39 y.o. female presents to office today for physical. She has complaints of chronic low back pain. Mostly thoracic with radiation to hip and pelvis. She denies any injury. Pain is described as aching and pressure.    Patient has been counseled on age-appropriate routine health concerns for screening and prevention. These are reviewed and up-to-date. Referrals  have been placed accordingly. Immunizations are up-to-date or declined.    MAMMOGRAM: Referred to [facility]CP PAP smear: Due 2023 ROS  Past Medical History:  Diagnosis Date  . GERD (gastroesophageal reflux disease)     Past Surgical History:  Procedure Laterality Date  . CESAREAN SECTION     3x    Family History  Problem Relation Age of Onset  . Diabetes Neg Hx   . Hypertension Neg Hx   . Colon cancer Neg Hx   . Esophageal cancer Neg Hx   . Pancreatic cancer Neg Hx   . Stomach cancer Neg Hx   . Liver disease Neg Hx     Social History Reviewed with no changes to be made today.   Outpatient Medications Prior to Visit  Medication Sig Dispense Refill  . pantoprazole (PROTONIX) 40 MG tablet Take 1 tablet (40 mg total) by mouth daily. 30 tablet 5   No facility-administered medications prior to visit.    No Known Allergies     Objective:    BP 109/74 (BP Location: Left Arm, Patient Position: Sitting, Cuff Size: Large)   Pulse 84   Temp 97.7 F (36.5 C) (Temporal)   Ht 5\' 6"  (1.676 m)   Wt 215 lb (97.5 kg)   LMP 05/09/2020   SpO2 96%   BMI 34.70 kg/m  Wt Readings from Last 3 Encounters:  05/29/20 215 lb (97.5 kg)  04/18/20 215  lb 9.6 oz (97.8 kg)  01/26/20 217 lb (98.4 kg)    Physical Exam Constitutional:      Appearance: She is well-developed.  HENT:     Head: Normocephalic and atraumatic.     Right Ear: External ear normal.     Left Ear: External ear normal.     Nose: Nose normal.     Mouth/Throat:     Pharynx: No oropharyngeal exudate.  Eyes:     General: No scleral icterus.       Right eye: No discharge.     Conjunctiva/sclera: Conjunctivae normal.     Pupils: Pupils are equal, round, and reactive to light.  Neck:     Thyroid: No thyromegaly.     Trachea: No tracheal deviation.  Cardiovascular:     Rate and Rhythm: Normal rate and regular rhythm.     Heart sounds: Normal heart sounds. No murmur heard.  No friction rub.  Pulmonary:     Effort:  Pulmonary effort is normal. No accessory muscle usage or respiratory distress.     Breath sounds: Normal breath sounds. No decreased breath sounds, wheezing, rhonchi or rales.  Chest:     Chest wall: No tenderness.     Breasts: Breasts are symmetrical.        Right: No inverted nipple, mass, nipple discharge, skin change or tenderness.        Left: No inverted nipple, mass, nipple discharge, skin change or tenderness.  Abdominal:     General: Bowel sounds are normal. There is no distension.     Palpations: Abdomen is soft. There is no mass.     Tenderness: There is no abdominal tenderness. There is no guarding or rebound.  Musculoskeletal:        General: No tenderness or deformity. Normal range of motion.     Cervical back: Normal range of motion and neck supple.     Thoracic back: Spasms present. No swelling, edema, deformity, tenderness or bony tenderness. Normal range of motion. No scoliosis.  Lymphadenopathy:     Cervical: No cervical adenopathy.     Upper Body:     Right upper body: No supraclavicular, axillary or pectoral adenopathy.     Left upper body: No supraclavicular, axillary or pectoral adenopathy.  Skin:    General: Skin is warm and dry.     Findings: No erythema.  Neurological:     Mental Status: She is alert and oriented to person, place, and time.     Cranial Nerves: No cranial nerve deficit.     Coordination: Coordination normal.     Deep Tendon Reflexes: Reflexes are normal and symmetric.  Psychiatric:        Speech: Speech normal.        Behavior: Behavior normal.        Thought Content: Thought content normal.        Judgment: Judgment normal.          Patient has been counseled extensively about nutrition and exercise as well as the importance of adherence with medications and regular follow-up. The patient was given clear instructions to go to ER or return to medical center if symptoms don't improve, worsen or new problems develop. The patient  verbalized understanding.   Follow-up: Return if symptoms worsen or fail to improve.   Claiborne Rigg, FNP-BC [facility] and [facility] [place], [place] [phone]   05/29/2020, 4:05 PM

## 2020-05-30 ENCOUNTER — Other Ambulatory Visit: Payer: Self-pay | Admitting: Obstetrics and Gynecology

## 2020-05-30 DIAGNOSIS — Z1231 Encounter for screening mammogram for malignant neoplasm of breast: Secondary | ICD-10-CM

## 2020-05-30 LAB — CBC
Hematocrit: 36 % (ref 34.0–46.6)
Hemoglobin: 12.3 g/dL (ref 11.1–15.9)
MCH: 30.6 pg (ref 26.6–33.0)
MCHC: 34.2 g/dL (ref 31.5–35.7)
MCV: 90 fL (ref 79–97)
Platelets: 272 10*3/uL (ref 150–450)
RBC: 4.02 x10E6/uL (ref 3.77–5.28)
RDW: 12.7 % (ref 11.7–15.4)
WBC: 6.9 10*3/uL (ref 3.4–10.8)

## 2020-05-30 LAB — LIPID PANEL
Chol/HDL Ratio: 3.9 ratio (ref 0.0–4.4)
Cholesterol, Total: 175 mg/dL (ref 100–199)
HDL: 45 mg/dL (ref >39)
LDL Chol Calc (NIH): 107 mg/dL — ABNORMAL HIGH (ref 0–99)
Triglycerides: 131 mg/dL (ref 0–149)
VLDL Cholesterol Cal: 23 mg/dL (ref 5–40)

## 2020-05-30 LAB — HEMOGLOBIN A1C
Est. average glucose Bld gHb Est-mCnc: 103 mg/dL
Hgb A1c MFr Bld: 5.2 % (ref 4.8–5.6)

## 2020-05-30 LAB — BASIC METABOLIC PANEL WITH GFR
BUN/Creatinine Ratio: 12 (ref 9–23)
BUN: 12 mg/dL (ref 6–24)
CO2: 21 mmol/L (ref 20–29)
Calcium: 9.1 mg/dL (ref 8.7–10.2)
Chloride: 104 mmol/L (ref 96–106)
Creatinine, Ser: 0.97 mg/dL (ref 0.57–1.00)
GFR calc Af Amer: 84 mL/min/{1.73_m2}
GFR calc non Af Amer: 73 mL/min/{1.73_m2}
Glucose: 90 mg/dL (ref 65–99)
Potassium: 4.1 mmol/L (ref 3.5–5.2)
Sodium: 139 mmol/L (ref 134–144)

## 2020-05-30 LAB — HEPATITIS C ANTIBODY: Hep C Virus Ab: 0.1 {s_co_ratio} (ref 0.0–0.9)

## 2020-06-08 ENCOUNTER — Ambulatory Visit: Payer: Self-pay

## 2020-06-12 ENCOUNTER — Ambulatory Visit: Payer: Self-pay | Attending: Nurse Practitioner

## 2020-06-12 ENCOUNTER — Other Ambulatory Visit: Payer: Self-pay

## 2020-07-18 ENCOUNTER — Ambulatory Visit: Payer: No Typology Code available for payment source | Admitting: *Deleted

## 2020-07-18 ENCOUNTER — Other Ambulatory Visit: Payer: Self-pay

## 2020-07-18 ENCOUNTER — Encounter (INDEPENDENT_AMBULATORY_CARE_PROVIDER_SITE_OTHER): Payer: Self-pay

## 2020-07-18 ENCOUNTER — Ambulatory Visit
Admission: RE | Admit: 2020-07-18 | Discharge: 2020-07-18 | Disposition: A | Discharge status: Home / Self Care | Payer: No Typology Code available for payment source | Source: Ambulatory Visit | Attending: Obstetrics and Gynecology | Admitting: Obstetrics and Gynecology

## 2020-07-18 VITALS — BP 116/82 | Wt 219.6 lb

## 2020-07-18 DIAGNOSIS — Z1239 Encounter for other screening for malignant neoplasm of breast: Secondary | ICD-10-CM

## 2020-07-18 DIAGNOSIS — Z1231 Encounter for screening mammogram for malignant neoplasm of breast: Secondary | ICD-10-CM

## 2020-07-18 NOTE — Progress Notes (Signed)
[name] is a 39 y.o. female who presents to [facility] clinic today with no complaints.    Pap Smear: Pap smear not completed today. Last Pap smear was 05/14/2019 at [facility] and was normal with negative HPV. Per patient has no history of an abnormal Pap smear. Last Pap smear result is available in Epic.   Physical exam: Breasts Left breast slightly larger than right breast that per patient is normal for her. No skin abnormalities bilateral breasts. No nipple retraction bilateral breasts. No nipple discharge bilateral breasts. No lymphadenopathy. No lumps palpated bilateral breasts. No complaints of pain or tenderness on exam.       Pelvic/Bimanual Pap is not indicated today per [facility]CP guidelines.    Smoking History: Patient has never smoked.   Patient Navigation: Patient education provided. Access to services provided for patient through [place] program. Spanish interpreter [name] from [facility] provided.    Breast and Cervical Cancer Risk Assessment: Patient has family history of her maternal grandmother having breast cancer. Patient has no known genetic mutations or history of radiation treatment to the chest before age 31. Patient does not have history of cervical dysplasia, immunocompromised, or DES exposure in-utero.  Risk Assessment    Risk Scores      07/18/2020   Last edited by: Narda Rutherford, LPN   5-year risk: 0.3 %   Lifetime risk: 5.1 %         A: [facility]CP exam without pap smear No complaints.  P: Referred patient to the [facility] for a screening mammogram on the mobile unit. Appointment scheduled Tuesday, July 18, 2020 at 1540.  [name], RN 07/18/2020 3:29 PM

## 2020-07-18 NOTE — Patient Instructions (Signed)
Explained breast self awareness with Sara [name]. Patient did not need a Pap smear today due to last Pap smear and HPV typing was 05/14/2019. Let her know [facility]CP will cover Pap smears and HPV typing every 5 years unless has a history of abnormal Pap smears. Referred patient to the [facility] for a screening mammogram on the mobile unit. Appointment scheduled Tuesday, July 18, 2020 at 1540. Patient escorted to the mobile unit following B[facility]CP appointment for her screening mammogram. Let patient know the Breast Center will follow up with her within the next couple weeks with results of her mammogram by letter or phone. Sara [name] verbalized understanding.  Pollie Poma, Kathaleen Maser, RN 3:29 PM

## 2020-07-21 MED FILL — PANTOPRAZOLE SOD DR 40 MG T: 40 | 30 days supply | Qty: 30 | Fill #1

## 2020-08-30 MED FILL — PANTOPRAZOLE SOD DR 40 MG T: 40 | 30 days supply | Qty: 30 | Fill #2

## 2020-10-04 MED FILL — PANTOPRAZOLE SOD DR 40 MG T: 40 | 30 days supply | Qty: 30 | Fill #3

## 2020-11-11 ENCOUNTER — Other Ambulatory Visit: Payer: Self-pay

## 2020-11-23 ENCOUNTER — Other Ambulatory Visit: Payer: Self-pay

## 2020-11-23 MED FILL — Pantoprazole Sodium EC Tab 40 MG (Base Equiv): ORAL | 30 days supply | Qty: 30 | Fill #0 | Status: AC

## 2020-11-28 ENCOUNTER — Other Ambulatory Visit: Payer: Self-pay

## 2020-12-25 ENCOUNTER — Other Ambulatory Visit: Payer: Self-pay

## 2020-12-25 MED FILL — Pantoprazole Sodium EC Tab 40 MG (Base Equiv): ORAL | 30 days supply | Qty: 30 | Fill #1 | Status: AC

## 2020-12-27 ENCOUNTER — Other Ambulatory Visit: Payer: Self-pay

## 2021-01-30 ENCOUNTER — Other Ambulatory Visit: Payer: Self-pay | Admitting: Nurse Practitioner

## 2021-01-30 ENCOUNTER — Other Ambulatory Visit: Payer: Self-pay

## 2021-01-30 DIAGNOSIS — K219 Gastro-esophageal reflux disease without esophagitis: Secondary | ICD-10-CM

## 2021-01-30 MED ORDER — PANTOPRAZOLE SODIUM 40 MG PO TBEC
DELAYED_RELEASE_TABLET | Freq: Every day | ORAL | 0 refills | Status: DC
  Filled 2021-01-30 – 2021-01-31 (×2): qty 30, 30d supply, fill #0
  Filled 2021-02-27 – 2021-03-08 (×4): qty 30, 30d supply, fill #1
  Filled 2021-04-05: qty 30, 30d supply, fill #2

## 2021-01-31 ENCOUNTER — Other Ambulatory Visit: Payer: Self-pay

## 2021-02-02 ENCOUNTER — Other Ambulatory Visit: Payer: Self-pay

## 2021-02-07 ENCOUNTER — Telehealth: Payer: Self-pay | Admitting: Nurse Practitioner

## 2021-02-07 ENCOUNTER — Ambulatory Visit: Payer: No Typology Code available for payment source

## 2021-02-27 ENCOUNTER — Other Ambulatory Visit: Payer: Self-pay

## 2021-03-07 ENCOUNTER — Other Ambulatory Visit: Payer: Self-pay

## 2021-03-08 ENCOUNTER — Other Ambulatory Visit: Payer: Self-pay

## 2021-03-21 ENCOUNTER — Encounter: Payer: No Typology Code available for payment source | Admitting: Nurse Practitioner

## 2021-04-05 ENCOUNTER — Other Ambulatory Visit: Payer: Self-pay

## 2021-04-09 ENCOUNTER — Other Ambulatory Visit: Payer: Self-pay

## 2021-04-09 ENCOUNTER — Ambulatory Visit: Payer: Self-pay | Attending: Nurse Practitioner | Admitting: Nurse Practitioner

## 2021-04-09 ENCOUNTER — Encounter: Payer: Self-pay | Admitting: Nurse Practitioner

## 2021-04-09 VITALS — BP 115/80 | HR 96 | Wt 219.4 lb

## 2021-04-09 DIAGNOSIS — K219 Gastro-esophageal reflux disease without esophagitis: Secondary | ICD-10-CM

## 2021-04-09 DIAGNOSIS — G44229 Chronic tension-type headache, not intractable: Secondary | ICD-10-CM

## 2021-04-09 DIAGNOSIS — K089 Disorder of teeth and supporting structures, unspecified: Secondary | ICD-10-CM

## 2021-04-09 DIAGNOSIS — Z0000 Encounter for general adult medical examination without abnormal findings: Secondary | ICD-10-CM

## 2021-04-09 MED ORDER — PANTOPRAZOLE SODIUM 40 MG PO TBEC
DELAYED_RELEASE_TABLET | Freq: Every day | ORAL | 0 refills | Status: DC
Start: 2021-04-09 — End: 2021-04-30
  Filled 2021-04-09: qty 90, fill #0

## 2021-04-09 MED ORDER — AMITRIPTYLINE HCL 25 MG PO TABS
25.0000 mg | ORAL_TABLET | Freq: Every day | ORAL | 1 refills | Status: DC
  Filled 2021-04-09: qty 30, 30d supply, fill #0

## 2021-04-09 MED ORDER — TIZANIDINE HCL 4 MG PO TABS
ORAL_TABLET | ORAL | 1 refills | Status: DC
  Filled 2021-04-09: qty 60, 20d supply, fill #0

## 2021-04-09 NOTE — Progress Notes (Signed)
Assessment & Plan:  Sara [name] was seen today for headache and annual exam.  Diagnoses and all orders for this visit:  Encounter for annual physical exam -     CMP14+EGFR -     Hemoglobin A1c -     Lipid panel -     CBC  Poor dentition -     Ambulatory referral to Dentistry  Chronic tension-type headache, not intractable -     amitriptyline (ELAVIL) 25 MG tablet; Take 1 tablet (25 mg total) by mouth at bedtime. -     tiZANidine (ZANAFLEX) 4 MG tablet; TAKE 1 CAPSULE (4 MG TOTAL) BY MOUTH 3 (THREE) TIMES DAILY AS NEEDED FOR MUSCLE SPASMS.  GERD without esophagitis -     pantoprazole (PROTONIX) 40 MG tablet; TAKE 1 TABLET (40 MG TOTAL) BY MOUTH DAILY.   Patient has been counseled on age-appropriate routine health concerns for screening and prevention. These are reviewed and up-to-date. Referrals have been placed accordingly. Immunizations are up-to-date or declined.    Subjective:   Chief Complaint  Patient presents with   Headache   Annual Exam   HPI Sara [name] 40 y.o. female presents to office today for annual physical exam. VRI was used to communicate directly with patient for the entire encounter including providing detailed patient instructions.    She  has a past medical history of GERD  HEADACHES She has complaints of intermittent headaches over the past few weeks for which she takes tylenol. The headaches can sometimes last up to several hours and occur in the occipital region and crown of the head. She denies a history of migraine headaches. Headaches are occurring every day. She has never had an eye exam. Denies floaters but does endorse blurred vision with reading. Blood pressure is well controlled. Denies chest pain, shortness of breath, palpitations, lightheadedness, dizziness, or BLE edema.   BP Readings from Last 3 Encounters:  04/09/21 115/80  07/18/20 116/82  05/29/20 109/74      Arthralgias Endorses intermittent pain in both wrists and hands.   Her job involves repetitive activity. Denies numbness, burning or tingling.    Review of Systems  Constitutional:  Negative for fever, malaise/fatigue and weight loss.  HENT: Negative.  Negative for nosebleeds.   Eyes: Negative.  Negative for blurred vision, double vision and photophobia.  Respiratory: Negative.  Negative for cough and shortness of breath.   Cardiovascular: Negative.  Negative for chest pain, palpitations and leg swelling.  Gastrointestinal:  Positive for heartburn. Negative for nausea and vomiting.  Musculoskeletal: Negative.  Negative for myalgias.  Neurological:  Positive for headaches. Negative for dizziness, focal weakness and seizures.  Psychiatric/Behavioral: Negative.  Negative for suicidal ideas.    Past Medical History:  Diagnosis Date   GERD (gastroesophageal reflux disease)     Past Surgical History:  Procedure Laterality Date   CESAREAN SECTION     3x   TUBAL LIGATION      Family History  Problem Relation Age of Onset   Breast cancer Maternal Grandmother    Diabetes Neg Hx    Hypertension Neg Hx    Colon cancer Neg Hx    Esophageal cancer Neg Hx    Pancreatic cancer Neg Hx    Stomach cancer Neg Hx    Liver disease Neg Hx     Social History Reviewed with no changes to be made today.   Outpatient Medications Prior to Visit  Medication Sig Dispense Refill   pantoprazole (PROTONIX) 40 MG tablet TAKE  1 TABLET (40 MG TOTAL) BY MOUTH DAILY. 90 tablet 0   lidocaine (LIDODERM) 5 % PLACE 1 PATCH ONTO THE SKIN DAILY. PLACE ON BACK (Patient not taking: Reported on 04/09/2021) 30 patch 1   tiZANidine (ZANAFLEX) 4 MG tablet TAKE 1 CAPSULE (4 MG TOTAL) BY MOUTH 3 (THREE) TIMES DAILY AS NEEDED FOR MUSCLE SPASMS. (Patient not taking: Reported on 04/09/2021) 60 tablet 1   No facility-administered medications prior to visit.    No Known Allergies     Objective:    BP 115/80   Pulse 96   Wt 219 lb 6 oz (99.5 kg)   LMP 04/01/2021 (Exact Date)   SpO2  98%   BMI 35.41 kg/m  Wt Readings from Last 3 Encounters:  04/09/21 219 lb 6 oz (99.5 kg)  07/18/20 219 lb 9.6 oz (99.6 kg)  05/29/20 215 lb (97.5 kg)    Physical Exam Vitals and nursing note reviewed.  Constitutional:      Appearance: She is well-developed.  HENT:     Head: Normocephalic and atraumatic.     Right Ear: Hearing, tympanic membrane, ear canal and external ear normal.     Left Ear: Hearing, tympanic membrane, ear canal and external ear normal.     Nose:     Right Turbinates: Not enlarged, swollen or pale.     Left Turbinates: Not enlarged, swollen or pale.     Right Sinus: No maxillary sinus tenderness or frontal sinus tenderness.     Left Sinus: No maxillary sinus tenderness or frontal sinus tenderness.     Mouth/Throat:     Dentition: Abnormal dentition. Dental caries present. No gingival swelling or dental abscesses.  Cardiovascular:     Rate and Rhythm: Normal rate and regular rhythm.     Heart sounds: Normal heart sounds. No murmur heard.   No friction rub. No gallop.  Pulmonary:     Effort: Pulmonary effort is normal. No tachypnea or respiratory distress.     Breath sounds: Normal breath sounds. No decreased breath sounds, wheezing, rhonchi or rales.  Chest:     Chest wall: No tenderness.  Abdominal:     General: Bowel sounds are normal.     Palpations: Abdomen is soft.  Musculoskeletal:        General: Normal range of motion.     Cervical back: Normal range of motion.  Skin:    General: Skin is warm and dry.  Neurological:     Mental Status: She is alert and oriented to person, place, and time.     Coordination: Coordination normal.  Psychiatric:        Behavior: Behavior normal. Behavior is cooperative.        Thought Content: Thought content normal.        Judgment: Judgment normal.         Patient has been counseled extensively about nutrition and exercise as well as the importance of adherence with medications and regular follow-up. The  patient was given clear instructions to go to ER or return to medical center if symptoms don't improve, worsen or new problems develop. The patient verbalized understanding.   Follow-up: Return in about 3 weeks (around 04/30/2021) for Headaches TELE 810 or 130.   Gildardo Pounds, FNP-BC [facility] and [place], [place]   04/09/2021, 7:20 PM

## 2021-04-09 NOTE — Progress Notes (Signed)
Sara [name] [id number]

## 2021-04-10 LAB — CBC
Hematocrit: 39.7 % (ref 34.0–46.6)
Hemoglobin: 13.5 g/dL (ref 11.1–15.9)
MCH: 29.9 pg (ref 26.6–33.0)
MCHC: 34 g/dL (ref 31.5–35.7)
MCV: 88 fL (ref 79–97)
Platelets: 302 10*3/uL (ref 150–450)
RBC: 4.52 x10E6/uL (ref 3.77–5.28)
RDW: 12.4 % (ref 11.7–15.4)
WBC: 7.8 10*3/uL (ref 3.4–10.8)

## 2021-04-10 LAB — CMP14+EGFR
ALT: 17 [IU]/L (ref 0–32)
AST: 19 [IU]/L (ref 0–40)
Albumin/Globulin Ratio: 2 (ref 1.2–2.2)
Albumin: 4.9 g/dL — ABNORMAL HIGH (ref 3.8–4.8)
Alkaline Phosphatase: 121 [IU]/L (ref 44–121)
BUN/Creatinine Ratio: 13 (ref 9–23)
BUN: 9 mg/dL (ref 6–24)
Bilirubin Total: 0.5 mg/dL (ref 0.0–1.2)
CO2: 21 mmol/L (ref 20–29)
Calcium: 9.6 mg/dL (ref 8.7–10.2)
Chloride: 100 mmol/L (ref 96–106)
Creatinine, Ser: 0.72 mg/dL (ref 0.57–1.00)
Globulin, Total: 2.5 g/dL (ref 1.5–4.5)
Glucose: 91 mg/dL (ref 65–99)
Potassium: 3.9 mmol/L (ref 3.5–5.2)
Sodium: 138 mmol/L (ref 134–144)
Total Protein: 7.4 g/dL (ref 6.0–8.5)
eGFR: 108 mL/min/{1.73_m2}

## 2021-04-10 LAB — LIPID PANEL
Chol/HDL Ratio: 3.6 ratio (ref 0.0–4.4)
Cholesterol, Total: 190 mg/dL (ref 100–199)
HDL: 53 mg/dL
LDL Chol Calc (NIH): 120 mg/dL — ABNORMAL HIGH (ref 0–99)
Triglycerides: 95 mg/dL (ref 0–149)
VLDL Cholesterol Cal: 17 mg/dL (ref 5–40)

## 2021-04-10 LAB — HEMOGLOBIN A1C
Est. average glucose Bld gHb Est-mCnc: 108 mg/dL
Hgb A1c MFr Bld: 5.4 % (ref 4.8–5.6)

## 2021-04-17 ENCOUNTER — Other Ambulatory Visit: Payer: Self-pay

## 2021-04-17 ENCOUNTER — Ambulatory Visit: Payer: Self-pay | Attending: Nurse Practitioner

## 2021-04-30 ENCOUNTER — Other Ambulatory Visit: Payer: Self-pay | Admitting: Nurse Practitioner

## 2021-04-30 ENCOUNTER — Other Ambulatory Visit: Payer: Self-pay

## 2021-04-30 ENCOUNTER — Encounter: Payer: Self-pay | Admitting: Nurse Practitioner

## 2021-04-30 ENCOUNTER — Ambulatory Visit: Payer: Self-pay | Attending: Nurse Practitioner | Admitting: Nurse Practitioner

## 2021-04-30 DIAGNOSIS — K219 Gastro-esophageal reflux disease without esophagitis: Secondary | ICD-10-CM

## 2021-04-30 DIAGNOSIS — G44229 Chronic tension-type headache, not intractable: Secondary | ICD-10-CM

## 2021-04-30 MED ORDER — PANTOPRAZOLE SODIUM 40 MG PO TBEC
DELAYED_RELEASE_TABLET | Freq: Every day | ORAL | 0 refills | Status: DC
  Filled 2021-04-30: qty 30, 30d supply, fill #0
  Filled 2021-06-07: qty 30, 30d supply, fill #1
  Filled 2021-07-04: qty 30, 30d supply, fill #2

## 2021-04-30 MED ORDER — AMITRIPTYLINE HCL 10 MG PO TABS
10.0000 mg | ORAL_TABLET | Freq: Every day | ORAL | 3 refills | Status: DC
  Filled 2021-04-30: qty 30, 30d supply, fill #0

## 2021-04-30 NOTE — Progress Notes (Signed)
Appt made for 05/08/2021 at 1:30 with Sara [name] via tele visit.

## 2021-04-30 NOTE — Progress Notes (Signed)
Virtual Visit via Telephone Note Due to national recommendations of social distancing due to COVID 19, telehealth visit is felt to be most appropriate for this patient at this time.  I discussed the limitations, risks, security and privacy concerns of performing an evaluation and management service by telephone and the availability of in person appointments. I also discussed with the patient that there may be a patient responsible charge related to this service. The patient expressed understanding and agreed to proceed.    I connected with Sara [name] on 04/30/21  at   1:30 PM EDT  EDT by telephone and verified that I am speaking with the correct person using two identifiers.  Location of Patient: Private Residence   [facility] of Provider: [facility]    Persons participating in Telemedicine visit: Bertram Denver FNP-BC Sara [name]    History of Present Illness: Telemedicine visit for: Follow up to headaches  She was prescribed the following on 04-09-2021:      amitriptyline (ELAVIL) 25 MG tablet; Take 1 tablet (25 mg total) by mouth at bedtime. -     tiZANidine (ZANAFLEX) 4 MG tablet; TAKE 1 CAPSULE (4 MG TOTAL) BY MOUTH 3 (THREE) TIMES DAILY AS NEEDED FOR MUSCLE SPASMS. At that time she was endorsing intermittent headaches over the past few weeks for which she had taken tylenol with no relief. The headaches can sometimes last up to several hours and occur in the occipital region and crown of the head. She denies a history of migraine headaches. Headaches are occurring every day. She has never had an eye exam. Denies floaters but does endorse blurred vision with reading. Blood pressure is well controlled. Denies chest pain, shortness of breath, palpitations, lightheadedness, dizziness, or BLE edema.  She states she elavil tends to make her very sleepy with 25 mg nightly. I have instructed her to take half a tablet to see if this will help. She  notes significant relief of headaches with taking elavil.     Past Medical History:  Diagnosis Date   GERD (gastroesophageal reflux disease)     Past Surgical History:  Procedure Laterality Date   CESAREAN SECTION     3x   TUBAL LIGATION      Family History  Problem Relation Age of Onset   Breast cancer Maternal Grandmother    Diabetes Neg Hx    Hypertension Neg Hx    Colon cancer Neg Hx    Esophageal cancer Neg Hx    Pancreatic cancer Neg Hx    Stomach cancer Neg Hx    Liver disease Neg Hx     Social History   Socioeconomic History   Marital status: Significant Other    Spouse name: Not on file   Number of children: 4   Years of education: Not on file   Highest education level: 6th grade  Occupational History   Not on file  Tobacco Use   Smoking status: Never   Smokeless tobacco: Never  Vaping Use   Vaping Use: Never used  Substance and Sexual Activity   Alcohol use: Yes    Comment: socially   Drug use: No   Sexual activity: Yes    Birth control/protection: Surgical  Other Topics Concern   Not on file  Social History Narrative   ** Merged History Encounter **       Social Determinants of Health   Financial Resource Strain: Not on file  Food Insecurity: Not on file  Transportation Needs:  No Transportation Needs   Lack of Transportation (Medical): No   Lack of Transportation (Non-Medical): No  Physical Activity: Not on file  Stress: Not on file  Social Connections: Not on file     Observations/Objective: Awake, alert and oriented x 3   Review of Systems  Constitutional:  Negative for fever, malaise/fatigue and weight loss.  HENT: Negative.  Negative for nosebleeds.   Eyes: Negative.  Negative for blurred vision, double vision and photophobia.  Respiratory: Negative.  Negative for cough and shortness of breath.   Cardiovascular: Negative.  Negative for chest pain, palpitations and leg swelling.  Gastrointestinal:  Positive for heartburn.  Negative for nausea and vomiting.  Musculoskeletal: Negative.  Negative for myalgias.  Neurological: Negative.  Negative for dizziness, focal weakness, seizures and headaches.  Psychiatric/Behavioral: Negative.  Negative for suicidal ideas.    Assessment and Plan: Diagnoses and all orders for this visit:  Chronic tension-type headache, not intractable Decrease elavil to 10 mg   GERD without esophagitis -     pantoprazole (PROTONIX) 40 MG tablet; TAKE 1 TABLET (40 MG TOTAL) BY MOUTH DAILY. INSTRUCTIONS: Avoid GERD Triggers: acidic, spicy or fried foods, caffeine, coffee, sodas,  alcohol and chocolate.     Follow Up Instructions Return in about 8 days (around 05/08/2021) for TELE 130 Headaches.     I discussed the assessment and treatment plan with the patient. The patient was provided an opportunity to ask questions and all were answered. The patient agreed with the plan and demonstrated an understanding of the instructions.   The patient was advised to call back or seek an in-person evaluation if the symptoms worsen or if the condition fails to improve as anticipated.  I provided 14 minutes of non-face-to-face time during this encounter including median intraservice time, reviewing previous notes, labs, imaging, medications and explaining diagnosis and management.  Claiborne Rigg, FNP-BC

## 2021-05-02 ENCOUNTER — Other Ambulatory Visit: Payer: Self-pay

## 2021-05-08 ENCOUNTER — Telehealth: Payer: Self-pay | Admitting: Nurse Practitioner

## 2021-05-08 ENCOUNTER — Encounter: Payer: Self-pay | Admitting: Nurse Practitioner

## 2021-05-08 ENCOUNTER — Other Ambulatory Visit: Payer: Self-pay

## 2021-05-08 ENCOUNTER — Ambulatory Visit: Payer: Self-pay | Attending: Nurse Practitioner | Admitting: Nurse Practitioner

## 2021-05-08 DIAGNOSIS — R519 Headache, unspecified: Secondary | ICD-10-CM

## 2021-05-08 DIAGNOSIS — G44229 Chronic tension-type headache, not intractable: Secondary | ICD-10-CM

## 2021-05-08 NOTE — Telephone Encounter (Signed)
No answer. LVM with assistance from spanish interpreter [phone]

## 2021-05-08 NOTE — Progress Notes (Signed)
Virtual Visit via Telephone Note Due to national recommendations of social distancing due to COVID 19, telehealth visit is felt to be most appropriate for this patient at this time.  I discussed the limitations, risks, security and privacy concerns of performing an evaluation and management service by telephone and the availability of in person appointments. I also discussed with the patient that there may be a patient responsible charge related to this service. The patient expressed understanding and agreed to proceed.    I connected with Sara [name] on 05/08/21  at   1:30 PM EDT  EDT by telephone and verified that I am speaking with the correct person using two identifiers.  Location of Patient: Private Residence   [facility] of Provider: [facility]    Persons participating in Telemedicine visit: Sara [name] Sara [name]  Daughter is translating for her   History of Present Illness: Telemedicine visit for: Headaches  She was instructed to decrease her amitriptyline to 12.5 mg after being prescribed the following on 04-09-2021:      amitriptyline (ELAVIL) 25 MG tablet; Take 1 tablet (25 mg total) by mouth at bedtime. -     tiZANidine (ZANAFLEX) 4 MG tablet; TAKE 1 CAPSULE (4 MG TOTAL) BY MOUTH 3 (THREE) TIMES DAILY AS NEEDED FOR MUSCLE SPASMS.  She had been complaining of excessive drowsiness with taking 25 mg nightly and we had agreed that she would decrease the dosage.  Today she reports improvement in headaches and decreased drowsiness.  We will continue on amitriptyline 12.5 mg at this time.   She experiences intermittent headaches several times a week. The headaches can sometimes last up to several hours and occur in the occipital region and crown of the head. She denies a history of migraine headaches. Headaches are occurring every day. She has never had an eye exam. Denies floaters but does endorse blurred vision with reading.  Blood pressure is well controlled. Denies chest pain, shortness of breath, palpitations, lightheadedness, dizziness, or BLE edema.  .      Past Medical History:  Diagnosis Date   GERD (gastroesophageal reflux disease)     Past Surgical History:  Procedure Laterality Date   CESAREAN SECTION     3x   TUBAL LIGATION      Family History  Problem Relation Age of Onset   Breast cancer Maternal Grandmother    Diabetes Neg Hx    Hypertension Neg Hx    Colon cancer Neg Hx    Esophageal cancer Neg Hx    Pancreatic cancer Neg Hx    Stomach cancer Neg Hx    Liver disease Neg Hx     Social History   Socioeconomic History   Marital status: Significant Other    Spouse name: Not on file   Number of children: 4   Years of education: Not on file   Highest education level: 6th grade  Occupational History   Not on file  Tobacco Use   Smoking status: Never   Smokeless tobacco: Never  Vaping Use   Vaping Use: Never used  Substance and Sexual Activity   Alcohol use: Yes    Comment: socially   Drug use: No   Sexual activity: Yes    Birth control/protection: Surgical  Other Topics Concern   Not on file  Social History Narrative   ** Merged History Encounter **       Social Determinants of Health   Financial Resource Strain: Not on file  Food Insecurity: Not on file  Transportation Needs: No Transportation Needs   Lack of Transportation (Medical): No   Lack of Transportation (Non-Medical): No  Physical Activity: Not on file  Stress: Not on file  Social Connections: Not on file     Observations/Objective: Awake, alert and oriented x 3   Review of Systems  Constitutional:  Negative for fever, malaise/fatigue and weight loss.  HENT: Negative.  Negative for nosebleeds.   Eyes: Negative.  Negative for blurred vision, double vision and photophobia.  Respiratory: Negative.  Negative for cough and shortness of breath.   Cardiovascular: Negative.  Negative for chest pain,  palpitations and leg swelling.  Gastrointestinal: Negative.  Negative for heartburn, nausea and vomiting.  Musculoskeletal: Negative.  Negative for myalgias.  Neurological:  Positive for headaches. Negative for dizziness, focal weakness and seizures.  Psychiatric/Behavioral: Negative.  Negative for suicidal ideas.    Assessment and Plan: Diagnoses and all orders for this visit:  Chronic tension-type headache, not intractable Continue amitriptyline 12.5 mg nightly as prescribed.  Follow Up Instructions Return if symptoms worsen or fail to improve.     I discussed the assessment and treatment plan with the patient. The patient was provided an opportunity to ask questions and all were answered. The patient agreed with the plan and demonstrated an understanding of the instructions.   The patient was advised to call back or seek an in-person evaluation if the symptoms worsen or if the condition fails to improve as anticipated.  I provided 7 minutes of non-face-to-face time during this encounter including median intraservice time, reviewing previous notes, labs, imaging, medications and explaining diagnosis and management.  Claiborne Rigg, [name]

## 2021-06-07 ENCOUNTER — Other Ambulatory Visit: Payer: Self-pay

## 2021-06-08 ENCOUNTER — Other Ambulatory Visit: Payer: Self-pay

## 2021-07-04 ENCOUNTER — Other Ambulatory Visit: Payer: Self-pay

## 2021-08-15 ENCOUNTER — Other Ambulatory Visit: Payer: Self-pay | Admitting: Nurse Practitioner

## 2021-08-15 ENCOUNTER — Other Ambulatory Visit: Payer: Self-pay

## 2021-08-15 DIAGNOSIS — K219 Gastro-esophageal reflux disease without esophagitis: Secondary | ICD-10-CM

## 2021-11-27 ENCOUNTER — Encounter: Payer: Self-pay | Admitting: Nurse Practitioner

## 2021-11-27 ENCOUNTER — Other Ambulatory Visit: Payer: Self-pay

## 2021-11-27 ENCOUNTER — Ambulatory Visit: Payer: Self-pay | Attending: Nurse Practitioner | Admitting: Nurse Practitioner

## 2021-11-27 DIAGNOSIS — Z7689 Persons encountering health services in other specified circumstances: Secondary | ICD-10-CM

## 2021-11-27 DIAGNOSIS — G44229 Chronic tension-type headache, not intractable: Secondary | ICD-10-CM

## 2021-11-27 DIAGNOSIS — K219 Gastro-esophageal reflux disease without esophagitis: Secondary | ICD-10-CM

## 2021-11-27 MED ORDER — PANTOPRAZOLE SODIUM 40 MG PO TBEC
DELAYED_RELEASE_TABLET | Freq: Every day | ORAL | 1 refills | Status: DC
  Filled 2021-11-27: qty 30, 30d supply, fill #0
  Filled 2022-04-30: qty 90, 90d supply, fill #1

## 2021-11-27 MED ORDER — AMITRIPTYLINE HCL 10 MG PO TABS
10.0000 mg | ORAL_TABLET | Freq: Every day | ORAL | 1 refills | Status: DC
  Filled 2021-11-27: qty 30, 30d supply, fill #0
  Filled 2022-04-30: qty 90, 90d supply, fill #1

## 2021-11-27 NOTE — Progress Notes (Signed)
Virtual Visit via Telephone Note ? I discussed the limitations, risks, security and privacy concerns of performing an evaluation and management service by telephone and the availability of in person appointments. I also discussed with the patient that there may be a patient responsible charge related to this service. The patient expressed understanding and agreed to proceed.  ? ? ?I connected with Sara [name] on 11/27/21  at   2:50 PM EDT  EDT by telephone and verified that I am speaking with the correct person using two identifiers. ? ?Location of Patient: ?Private Residence ?  ?Location of Provider: ?[facility] [employer] [facility]  ?  ?Persons participating in Telemedicine visit: ?Geryl Rankins FNP-BC ?Deb [name]  ?  ?History of Present Illness: ?Telemedicine visit for: Headaches and GERD ? ?Headaches ?She has chronic intermittent headaches. OTC medications tried in the past include tylenol. The headaches can sometimes last up to several hours and occur in the occipital region and crown of the head.  She has never had an eye exam. Denies floaters but does endorse blurred vision with reading. Blood pressure is well controlled. Denies chest pain, shortness of breath, palpitations, lightheadedness, dizziness, or BLE edema.  She notes significant relief of headaches with taking elavil 10mg  daily  ? ?GERD ?Symptoms well controlled with protonix 40 mg daily. She is requesting a refill of the medication today.  ? ?Past Medical History:  ?Diagnosis Date  ? GERD (gastroesophageal reflux disease)   ?  ?Past Surgical History:  ?Procedure Laterality Date  ? CESAREAN SECTION    ? 3x  ? TUBAL LIGATION    ?  ?Family History  ?Problem Relation Age of Onset  ? Breast cancer Maternal Grandmother   ? Diabetes Neg Hx   ? Hypertension Neg Hx   ? Colon cancer Neg Hx   ? Esophageal cancer Neg Hx   ? Pancreatic cancer Neg Hx   ? Stomach cancer Neg Hx   ? Liver disease Neg Hx   ?  ?Social History   ? ?Socioeconomic History  ? Marital status: Significant Other  ?  Spouse name: Not on file  ? Number of children: 4  ? Years of education: Not on file  ? Highest education level: 6th grade  ?Occupational History  ? Not on file  ?Tobacco Use  ? Smoking status: Never  ? Smokeless tobacco: Never  ?Vaping Use  ? Vaping Use: Never used  ?Substance and Sexual Activity  ? Alcohol use: Yes  ?  Comment: socially  ? Drug use: No  ? Sexual activity: Yes  ?  Birth control/protection: Surgical  ?Other Topics Concern  ? Not on file  ?Social History Narrative  ? ** Merged History Encounter **  ?    ? ?Social Determinants of Health  ? ?Financial Resource Strain: Not on file  ?Food Insecurity: Not on file  ?Transportation Needs: Not on file  ?Physical Activity: Not on file  ?Stress: Not on file  ?Social Connections: Not on file  ?  ? ?Observations/Objective: ?Awake, alert and oriented x 3 ? ? ?Review of Systems  ?Constitutional:  Negative for fever, malaise/fatigue and weight loss.  ?HENT: Negative.  Negative for nosebleeds.   ?Eyes: Negative.  Negative for blurred vision, double vision and photophobia.  ?Respiratory: Negative.  Negative for cough and shortness of breath.   ?Cardiovascular: Negative.  Negative for chest pain, palpitations and leg swelling.  ?Gastrointestinal:  Positive for heartburn. Negative for nausea and vomiting.  ?Musculoskeletal: Negative.  Negative for myalgias.  ?  Neurological:  Positive for headaches. Negative for dizziness, focal weakness and seizures.  ?Psychiatric/Behavioral: Negative.  Negative for suicidal ideas.    ?Assessment and Plan: ?There are no diagnoses linked to this encounter.  ? ?Follow Up Instructions ?No follow-ups on file.  ? ?  ?I discussed the assessment and treatment plan with the patient. The patient was provided an opportunity to ask questions and all were answered. The patient agreed with the plan and demonstrated an understanding of the instructions. ?  ?The patient was advised to  call back or seek an in-person evaluation if the symptoms worsen or if the condition fails to improve as anticipated. ? ?I provided 13 minutes of non-face-to-face time during this encounter including median intraservice time, reviewing previous notes, labs, imaging, medications and explaining diagnosis and management. ? ?Gildardo Pounds, FNP-BC  ?

## 2021-11-28 ENCOUNTER — Other Ambulatory Visit: Payer: Self-pay

## 2021-12-03 ENCOUNTER — Other Ambulatory Visit: Payer: Self-pay

## 2021-12-03 DIAGNOSIS — Z1231 Encounter for screening mammogram for malignant neoplasm of breast: Secondary | ICD-10-CM

## 2021-12-20 ENCOUNTER — Ambulatory Visit
Admission: RE | Admit: 2021-12-20 | Discharge: 2021-12-20 | Disposition: A | Discharge status: Home / Self Care | Payer: No Typology Code available for payment source | Source: Ambulatory Visit | Attending: Nurse Practitioner | Admitting: Nurse Practitioner

## 2021-12-20 DIAGNOSIS — Z1231 Encounter for screening mammogram for malignant neoplasm of breast: Secondary | ICD-10-CM

## 2022-03-07 ENCOUNTER — Ambulatory Visit (HOSPITAL_COMMUNITY)
Admission: EM | Admit: 2022-03-07 | Discharge: 2022-03-07 | Disposition: A | Discharge status: Home / Self Care | Payer: No Typology Code available for payment source | Attending: Family Medicine | Admitting: Family Medicine

## 2022-03-07 ENCOUNTER — Encounter (HOSPITAL_COMMUNITY): Payer: Self-pay | Admitting: *Deleted

## 2022-03-07 DIAGNOSIS — N3091 Cystitis, unspecified with hematuria: Secondary | ICD-10-CM

## 2022-03-07 DIAGNOSIS — N3090 Cystitis, unspecified without hematuria: Secondary | ICD-10-CM | POA: Insufficient documentation

## 2022-03-07 LAB — POCT URINALYSIS DIPSTICK, ED / UC
Bilirubin Urine: NEGATIVE
Glucose, UA: NEGATIVE mg/dL
Ketones, ur: NEGATIVE mg/dL
Nitrite: NEGATIVE
Protein, ur: NEGATIVE mg/dL
Specific Gravity, Urine: <=1.005 (ref 1.005–1.030)
Urobilinogen, UA: 0.2 mg/dL (ref 0.0–1.0)
pH: 6 (ref 5.0–8.0)

## 2022-03-07 LAB — POC URINE PREG, ED: Preg Test, Ur: NEGATIVE

## 2022-03-07 MED ORDER — NITROFURANTOIN MONOHYD MACRO 100 MG PO CAPS: 100.0000 mg | ORAL_CAPSULE | Freq: Two times a day (BID) | ORAL | 0 refills | Status: DC

## 2022-03-07 MED ORDER — PHENAZOPYRIDINE HCL 100 MG PO TABS: 100.0000 mg | ORAL_TABLET | Freq: Three times a day (TID) | ORAL | 0 refills | Status: DC | PRN

## 2022-03-07 NOTE — ED Triage Notes (Signed)
C/O dysuria with low abd pain x 4 days without fevers.

## 2022-03-07 NOTE — ED Provider Notes (Signed)
[facility]    CSN: [id number] Arrival date & time: 03/07/22  1043      History   Chief Complaint Chief Complaint  Patient presents with   Dysuria    HPI Sara [name] is a 41 y.o. female.    Dysuria  Here for a 2-day history of dysuria, incomplete bladder emptying, and urinary frequency.  No fever or chills.  No nausea or vomiting.  Last menstrual cycle was July 5.  She has had a little blood in her urine when she urinates  Past Medical History:  Diagnosis Date   GERD (gastroesophageal reflux disease)     Patient Active Problem List   Diagnosis Date Noted   LUQ pain 11/17/2019   GERD without esophagitis 11/17/2019   Encounter for other preprocedural examination 11/17/2019   Normocytic anemia 02/05/2016   RUQ pain 02/16/2014   Other hemorrhoids 02/16/2014   TINEA PEDIS 08/31/2009   PLANTAR FASCIITIS, LEFT 07/18/2009   SKIN RASH 07/18/2009    Past Surgical History:  Procedure Laterality Date   CESAREAN SECTION     3x   TUBAL LIGATION      OB History   No obstetric history on file.      Home Medications    Prior to Admission medications   Medication Sig Start Date End Date Taking? Authorizing Provider  amitriptyline (ELAVIL) 10 MG tablet Take 1 tablet (10 mg total) by mouth at bedtime. 11/27/21 05/26/22 Yes Claiborne Rigg, NP  nitrofurantoin, macrocrystal-monohydrate, (MACROBID) 100 MG capsule Take 1 capsule (100 mg total) by mouth 2 (two) times daily. 03/07/22  Yes Zenia Resides, MD  pantoprazole (PROTONIX) 40 MG tablet TAKE 1 TABLET (40 MG TOTAL) BY MOUTH DAILY. 11/27/21 05/26/22 Yes Claiborne Rigg, NP  phenazopyridine (PYRIDIUM) 100 MG tablet Take 1 tablet (100 mg total) by mouth 3 (three) times daily as needed (urinary pain). 03/07/22  Yes Zenia Resides, MD    Family History Family History  Problem Relation Age of Onset   Breast cancer Maternal Grandmother    Diabetes Neg Hx    Hypertension Neg Hx    Colon cancer  Neg Hx    Esophageal cancer Neg Hx    Pancreatic cancer Neg Hx    Stomach cancer Neg Hx    Liver disease Neg Hx     Social History Social History   Tobacco Use   Smoking status: Never   Smokeless tobacco: Never  Vaping Use   Vaping Use: Never used  Substance Use Topics   Alcohol use: Yes    Comment: socially   Drug use: No     Allergies   Patient has no known allergies.   Review of Systems Review of Systems  Genitourinary:  Positive for dysuria.     Physical Exam Triage Vital Signs ED Triage Vitals  Enc Vitals Group     BP 03/07/22 1104 132/84     Pulse Rate 03/07/22 1104 69     Resp 03/07/22 1104 18     Temp 03/07/22 1104 98 F (36.7 C)     Temp Source 03/07/22 1104 Oral     SpO2 03/07/22 1104 98 %     Weight --      Height --      Head Circumference --      Peak Flow --      Pain Score 03/07/22 1105 10     Pain Loc --      Pain Edu? --  Excl. in GC? --    No data found.  Updated Vital Signs BP 132/84   Pulse 69   Temp 98 F (36.7 C) (Oral)   Resp 18   LMP 02/12/2022 (Exact Date)   SpO2 98%   Visual Acuity Right Eye Distance:   Left Eye Distance:   Bilateral Distance:    Right Eye Near:   Left Eye Near:    Bilateral Near:     Physical Exam Vitals reviewed.  Constitutional:      General: She is not in acute distress.    Appearance: She is not ill-appearing, toxic-appearing or diaphoretic.  HENT:     Mouth/Throat:     Mouth: Mucous membranes are moist.  Eyes:     Extraocular Movements: Extraocular movements intact.     Pupils: Pupils are equal, round, and reactive to light.  Cardiovascular:     Rate and Rhythm: Normal rate and regular rhythm.     Heart sounds: No murmur heard. Pulmonary:     Breath sounds: Normal breath sounds.  Abdominal:     Palpations: Abdomen is soft.     Tenderness: There is no abdominal tenderness.  Musculoskeletal:     Cervical back: Neck supple.  Lymphadenopathy:     Cervical: No cervical  adenopathy.  Skin:    Coloration: Skin is not jaundiced or pale.  Neurological:     Mental Status: She is alert and oriented to person, place, and time.  Psychiatric:        Behavior: Behavior normal.      UC Treatments / Results  Labs (all labs ordered are listed, but only abnormal results are displayed) Labs Reviewed  POCT URINALYSIS DIPSTICK, ED / UC - Abnormal; Notable for the following components:      Result Value   Hgb urine dipstick LARGE (*)    Leukocytes,Ua MODERATE (*)    All other components within normal limits  URINE CULTURE  POC URINE PREG, ED    EKG   Radiology No results found.  Procedures Procedures (including critical care time)  Medications Ordered in UC Medications - No data to display  Initial Impression / Assessment and Plan / UC Course  I have reviewed the triage vital signs and the nursing notes.  Pertinent labs & imaging results that were available during my care of the patient were reviewed by me and considered in my medical decision making (see chart for details).     UA shows large amount of blood and white blood cells.  UPT is negative.  We will treat with nitrofurantoin and Pyridium as needed Final Clinical Impressions(s) / UC Diagnoses   Final diagnoses:  Cystitis     Discharge Instructions      The pregnancy test was negative (la prueba de [employer])  the urinalysis showed some red blood cells and white blood cells, indicating you most likely have a bladder infection(en la orina habian globulos blancos y rojos, ensenando que a lo mejor, hay infeccion en la vejiga).   take nitrofurantoin 100 mg--1 capsule 2 times daily for 5 days; it's the antibiotic(tome 1 capsula 2 veces al dia por 5 dias; es el antibiotico)    Pyridium 100 mg--take 1 every 8 hours as needed for bladder or urinary pain (tome 1 tableta 3 veces al dia para dolor en la vejiga)     ED Prescriptions     Medication Sig Dispense Auth. Provider    nitrofurantoin, macrocrystal-monohydrate, (MACROBID) 100 MG capsule Take 1  capsule (100 mg total) by mouth 2 (two) times daily. 10 capsule Zenia Resides, MD   phenazopyridine (PYRIDIUM) 100 MG tablet Take 1 tablet (100 mg total) by mouth 3 (three) times daily as needed (urinary pain). 10 tablet Marlinda Mike Janace Aris, MD      PDMP not reviewed this encounter.   Zenia Resides, MD 03/07/22 1154

## 2022-03-07 NOTE — Discharge Instructions (Addendum)
The pregnancy test was negative (la prueba de [employer])  the urinalysis showed some red blood cells and white blood cells, indicating you most likely have a bladder infection(en la orina habian globulos blancos y rojos, ensenando que a lo mejor, hay infeccion en la vejiga).   take nitrofurantoin 100 mg--1 capsule 2 times daily for 5 days; it's the antibiotic(tome 1 capsula 2 veces al dia por 5 dias; es el antibiotico)    Pyridium 100 mg--take 1 every 8 hours as needed for bladder or urinary pain (tome 1 tableta 3 veces al dia para dolor en la vejiga)

## 2022-03-09 LAB — URINE CULTURE: Culture: 20000 — AB

## 2022-04-30 ENCOUNTER — Other Ambulatory Visit: Payer: Self-pay

## 2022-05-02 ENCOUNTER — Other Ambulatory Visit: Payer: Self-pay

## 2022-06-11 ENCOUNTER — Encounter: Payer: Self-pay | Admitting: Nurse Practitioner

## 2022-06-11 ENCOUNTER — Other Ambulatory Visit: Payer: Self-pay

## 2022-06-11 ENCOUNTER — Ambulatory Visit: Payer: Self-pay | Attending: Nurse Practitioner | Admitting: Nurse Practitioner

## 2022-06-11 VITALS — BP 113/73 | HR 64 | Temp 97.9°F | Ht 66.0 in | Wt 220.0 lb

## 2022-06-11 DIAGNOSIS — Z0000 Encounter for general adult medical examination without abnormal findings: Secondary | ICD-10-CM

## 2022-06-11 DIAGNOSIS — Z0001 Encounter for general adult medical examination with abnormal findings: Secondary | ICD-10-CM

## 2022-06-11 DIAGNOSIS — G44229 Chronic tension-type headache, not intractable: Secondary | ICD-10-CM

## 2022-06-11 DIAGNOSIS — K219 Gastro-esophageal reflux disease without esophagitis: Secondary | ICD-10-CM

## 2022-06-11 MED ORDER — PANTOPRAZOLE SODIUM 40 MG PO TBEC
40.0000 mg | DELAYED_RELEASE_TABLET | Freq: Every day | ORAL | 3 refills | Status: AC
  Filled 2022-06-11: qty 90, 90d supply, fill #0

## 2022-06-11 MED ORDER — AMITRIPTYLINE HCL 10 MG PO TABS
10.0000 mg | ORAL_TABLET | Freq: Every day | ORAL | 2 refills | Status: AC
  Filled 2022-06-11: qty 90, 90d supply, fill #0

## 2022-06-11 NOTE — Progress Notes (Signed)
Assessment & Plan:  Sara [name] was seen today for annual exam.  Diagnoses and all orders for this visit:  Encounter for annual physical exam -     CMP14+EGFR -     Lipid panel -     CBC with Differential  Chronic tension-type headache, not intractable Well controlled -     amitriptyline (ELAVIL) 10 MG tablet; Take 1 tablet (10 mg total) by mouth at bedtime.  GERD without esophagitis -     pantoprazole (PROTONIX) 40 MG tablet; Take 1 tablet (40 mg total) by mouth daily. FOR GERD    Patient has been counseled on age-appropriate routine health concerns for screening and prevention. These are reviewed and up-to-date. Referrals have been placed accordingly. Immunizations are up-to-date or declined.    Subjective:   Chief Complaint  Patient presents with   Annual Exam   HPI Sara [name] 41 y.o. female presents to office today for annual physical. She has no questions or concerns today.  Review of Systems  Constitutional:  Negative for fever, malaise/fatigue and weight loss.  HENT: Negative.  Negative for nosebleeds.   Eyes: Negative.  Negative for blurred vision, double vision and photophobia.  Respiratory: Negative.  Negative for cough and shortness of breath.   Cardiovascular: Negative.  Negative for chest pain, palpitations and leg swelling.  Gastrointestinal: Negative.  Negative for heartburn, nausea and vomiting.  Genitourinary: Negative.   Musculoskeletal: Negative.  Negative for myalgias.  Skin: Negative.   Neurological: Negative.  Negative for dizziness, focal weakness, seizures and headaches.  Endo/Heme/Allergies: Negative.   Psychiatric/Behavioral: Negative.  Negative for suicidal ideas.     Past Medical History:  Diagnosis Date   GERD (gastroesophageal reflux disease)     Past Surgical History:  Procedure Laterality Date   CESAREAN SECTION     3x   TUBAL LIGATION      Family History  Problem Relation Age of Onset   Breast cancer Maternal  Grandmother    Diabetes Neg Hx    Hypertension Neg Hx    Colon cancer Neg Hx    Esophageal cancer Neg Hx    Pancreatic cancer Neg Hx    Stomach cancer Neg Hx    Liver disease Neg Hx     Social History Reviewed with no changes to be made today.   Outpatient Medications Prior to Visit  Medication Sig Dispense Refill   nitrofurantoin, macrocrystal-monohydrate, (MACROBID) 100 MG capsule Take 1 capsule (100 mg total) by mouth 2 (two) times daily. 10 capsule 0   pantoprazole (PROTONIX) 40 MG tablet TAKE 1 TABLET (40 MG TOTAL) BY MOUTH DAILY. 90 tablet 1   amitriptyline (ELAVIL) 10 MG tablet Take 1 tablet (10 mg total) by mouth at bedtime. (Patient not taking: Reported on 06/11/2022) 90 tablet 1   phenazopyridine (PYRIDIUM) 100 MG tablet Take 1 tablet (100 mg total) by mouth 3 (three) times daily as needed (urinary pain). (Patient not taking: Reported on 06/11/2022) 10 tablet 0   No facility-administered medications prior to visit.    No Known Allergies     Objective:    BP 113/73   Pulse 64   Temp 97.9 F (36.6 C) (Temporal)   Ht _0  (1.676 m)   Wt 220 lb (99.8 kg)   LMP 06/04/2022 (Approximate)   SpO2 98%   BMI 35.51 kg/m  Wt Readings from Last 3 Encounters:  06/11/22 220 lb (99.8 kg)  04/09/21 219 lb 6 oz (99.5 kg)  07/18/20 219 lb 9.6 oz (  99.6 kg)    Physical Exam Constitutional:      Appearance: She is well-developed.  HENT:     Head: Normocephalic and atraumatic.     Right Ear: Hearing, tympanic membrane, ear canal and external ear normal.     Left Ear: Hearing, tympanic membrane, ear canal and external ear normal.     Nose: Nose normal.     Right Turbinates: Not enlarged.     Left Turbinates: Not enlarged.     Mouth/Throat:     Lips: Pink.     Mouth: Mucous membranes are moist.     Dentition: No dental tenderness, gingival swelling, dental abscesses or gum lesions.     Pharynx: No oropharyngeal exudate.  Eyes:     General: No scleral icterus.       Right  eye: No discharge.     Extraocular Movements: Extraocular movements intact.     Conjunctiva/sclera: Conjunctivae normal.     Pupils: Pupils are equal, round, and reactive to light.  Neck:     Thyroid: No thyromegaly.     Trachea: No tracheal deviation.  Cardiovascular:     Rate and Rhythm: Normal rate and regular rhythm.     Heart sounds: Normal heart sounds. No murmur heard.    No friction rub.  Pulmonary:     Effort: Pulmonary effort is normal. No accessory muscle usage or respiratory distress.     Breath sounds: Normal breath sounds. No decreased breath sounds, wheezing, rhonchi or rales.  Abdominal:     General: Bowel sounds are normal. There is no distension.     Palpations: Abdomen is soft. There is no mass.     Tenderness: There is no abdominal tenderness. There is no right CVA tenderness, left CVA tenderness, guarding or rebound.     Hernia: No hernia is present.  Musculoskeletal:        General: No tenderness or deformity. Normal range of motion.     Cervical back: Normal range of motion and neck supple.  Lymphadenopathy:     Cervical: No cervical adenopathy.  Skin:    General: Skin is warm and dry.     Findings: No erythema.  Neurological:     Mental Status: She is alert and oriented to person, place, and time.     Cranial Nerves: No cranial nerve deficit.     Motor: Motor function is intact.     Coordination: Coordination is intact. Coordination normal.     Gait: Gait is intact.     Deep Tendon Reflexes:     Reflex Scores:      Patellar reflexes are 1+ on the right side and 1+ on the left side. Psychiatric:        Attention and Perception: Attention normal.        Mood and Affect: Mood normal.        Speech: Speech normal.        Behavior: Behavior normal.        Thought Content: Thought content normal.        Judgment: Judgment normal.          Patient has been counseled extensively about nutrition and exercise as well as the importance of adherence with  medications and regular follow-up. The patient was given clear instructions to go to ER or return to medical center if symptoms don't improve, worsen or new problems develop. The patient verbalized understanding.   Follow-up: Return if symptoms worsen or fail to improve.  Gildardo Pounds, FNP-BC [facility] [place], [place]   06/11/2022, 10:38 AM

## 2022-06-12 LAB — CMP14+EGFR
ALT: 21 IU/L (ref 0–32)
AST: 15 IU/L (ref 0–40)
Albumin/Globulin Ratio: 2.2 (ref 1.2–2.2)
Albumin: 4.7 g/dL (ref 3.9–4.9)
Alkaline Phosphatase: 109 IU/L (ref 44–121)
BUN/Creatinine Ratio: 12 (ref 9–23)
BUN: 9 mg/dL (ref 6–24)
Bilirubin Total: <0.2 mg/dL (ref 0.0–1.2)
CO2: 21 mmol/L (ref 20–29)
Calcium: 9.4 mg/dL (ref 8.7–10.2)
Chloride: 102 mmol/L (ref 96–106)
Creatinine, Ser: 0.76 mg/dL (ref 0.57–1.00)
Globulin, Total: 2.1 g/dL (ref 1.5–4.5)
Glucose: 88 mg/dL (ref 70–99)
Potassium: 4.3 mmol/L (ref 3.5–5.2)
Sodium: 139 mmol/L (ref 134–144)
Total Protein: 6.8 g/dL (ref 6.0–8.5)
eGFR: 100 mL/min/{1.73_m2} (ref >59)

## 2022-06-12 LAB — CBC WITH DIFFERENTIAL/PLATELET
Basophils Absolute: 0.1 10*3/uL (ref 0.0–0.2)
Basos: 1 %
EOS (ABSOLUTE): 0.3 10*3/uL (ref 0.0–0.4)
Eos: 4 %
Hematocrit: 38 % (ref 34.0–46.6)
Hemoglobin: 12.5 g/dL (ref 11.1–15.9)
Immature Grans (Abs): 0 10*3/uL (ref 0.0–0.1)
Immature Granulocytes: 0 %
Lymphocytes Absolute: 1.8 10*3/uL (ref 0.7–3.1)
Lymphs: 26 %
MCH: 29.2 pg (ref 26.6–33.0)
MCHC: 32.9 g/dL (ref 31.5–35.7)
MCV: 89 fL (ref 79–97)
Monocytes Absolute: 0.4 10*3/uL (ref 0.1–0.9)
Monocytes: 6 %
Neutrophils Absolute: 4.6 10*3/uL (ref 1.4–7.0)
Neutrophils: 63 %
Platelets: 292 10*3/uL (ref 150–450)
RBC: 4.28 x10E6/uL (ref 3.77–5.28)
RDW: 12.4 % (ref 11.7–15.4)
WBC: 7.2 10*3/uL (ref 3.4–10.8)

## 2022-06-12 LAB — LIPID PANEL
Chol/HDL Ratio: 3.8 ratio (ref 0.0–4.4)
Cholesterol, Total: 165 mg/dL (ref 100–199)
HDL: 43 mg/dL (ref >39)
LDL Chol Calc (NIH): 96 mg/dL (ref 0–99)
Triglycerides: 149 mg/dL (ref 0–149)
VLDL Cholesterol Cal: 26 mg/dL (ref 5–40)

## 2022-08-16 ENCOUNTER — Telehealth: Payer: Self-pay | Admitting: Nurse Practitioner

## 2022-08-16 NOTE — Telephone Encounter (Signed)
Copied from CRM #[id number]. Topic: General - Inquiry >> Aug 15, 2022 11:54 AM Teresa P wrote: Reason for CRM: Patient would like a call back to schedule an appt to get her orange card. Also patient would like call back to get results of studies she had. If someone is spanish can please call her. 

## 2022-08-16 NOTE — Telephone Encounter (Signed)
Unable to reach patient by phone. CRM created.

## 2022-08-16 NOTE — Telephone Encounter (Signed)
Copied from [place] [id number]. Topic: General - Inquiry >> Aug 15, 2022 11:54 AM Nilda Simmer wrote: Reason for CRM: Patient would like a call back to schedule an appt to get her orange card. Also patient would like call back to get results of studies she had. If someone is spanish can please call her.

## 2023-08-25 ENCOUNTER — Emergency Department (HOSPITAL_COMMUNITY)
Admission: EM | Admit: 2023-08-25 | Discharge: 2023-08-25 | Disposition: A | Discharge status: Home / Self Care | Payer: No Typology Code available for payment source | Attending: Emergency Medicine | Admitting: Emergency Medicine

## 2023-08-25 ENCOUNTER — Encounter (HOSPITAL_COMMUNITY): Payer: Self-pay | Admitting: Emergency Medicine

## 2023-08-25 ENCOUNTER — Emergency Department (HOSPITAL_COMMUNITY): Payer: No Typology Code available for payment source

## 2023-08-25 ENCOUNTER — Other Ambulatory Visit: Payer: Self-pay

## 2023-08-25 DIAGNOSIS — G8929 Other chronic pain: Secondary | ICD-10-CM

## 2023-08-25 DIAGNOSIS — H538 Other visual disturbances: Secondary | ICD-10-CM | POA: Insufficient documentation

## 2023-08-25 DIAGNOSIS — R519 Headache, unspecified: Secondary | ICD-10-CM | POA: Insufficient documentation

## 2023-08-25 LAB — CBC WITH DIFFERENTIAL/PLATELET
Abs Immature Granulocytes: 0.02 10*3/uL (ref 0.00–0.07)
Basophils Absolute: 0.1 10*3/uL (ref 0.0–0.1)
Basophils Relative: 1 %
Eosinophils Absolute: 0 10*3/uL (ref 0.0–0.5)
Eosinophils Relative: 1 %
HCT: 37 % (ref 36.0–46.0)
Hemoglobin: 12.4 g/dL (ref 12.0–15.0)
Immature Granulocytes: 0 %
Lymphocytes Relative: 19 %
Lymphs Abs: 1.1 10*3/uL (ref 0.7–4.0)
MCH: 30 pg (ref 26.0–34.0)
MCHC: 33.5 g/dL (ref 30.0–36.0)
MCV: 89.6 fL (ref 80.0–100.0)
Monocytes Absolute: 0.3 10*3/uL (ref 0.1–1.0)
Monocytes Relative: 6 %
Neutro Abs: 4.1 10*3/uL (ref 1.7–7.7)
Neutrophils Relative %: 73 %
Platelets: 280 10*3/uL (ref 150–400)
RBC: 4.13 MIL/uL (ref 3.87–5.11)
RDW: 12.4 % (ref 11.5–15.5)
WBC: 5.7 10*3/uL (ref 4.0–10.5)
nRBC: 0 % (ref 0.0–0.2)

## 2023-08-25 LAB — BASIC METABOLIC PANEL
Anion gap: 9 (ref 5–15)
BUN: 8 mg/dL (ref 6–20)
CO2: 22 mmol/L (ref 22–32)
Calcium: 9 mg/dL (ref 8.9–10.3)
Chloride: 108 mmol/L (ref 98–111)
Creatinine, Ser: 0.58 mg/dL (ref 0.44–1.00)
GFR, Estimated: >60 mL/min (ref >60)
Glucose, Bld: 88 mg/dL (ref 70–99)
Potassium: 4.1 mmol/L (ref 3.5–5.1)
Sodium: 139 mmol/L (ref 135–145)

## 2023-08-25 LAB — HCG, SERUM, QUALITATIVE: Preg, Serum: NEGATIVE

## 2023-08-25 LAB — ACETAMINOPHEN LEVEL: Acetaminophen (Tylenol), Serum: 15 ug/mL (ref 10–30)

## 2023-08-25 MED ORDER — METOCLOPRAMIDE HCL 5 MG/ML IJ SOLN
10.0000 mg | Freq: Once | INTRAMUSCULAR | Status: AC
  Administered 2023-08-25: 10 mg via INTRAVENOUS
  Filled 2023-08-25: qty 2

## 2023-08-25 MED ORDER — IOHEXOL 350 MG/ML SOLN
75.0000 mL | Freq: Once | INTRAVENOUS | Status: AC | PRN
  Administered 2023-08-25: 75 mL via INTRAVENOUS

## 2023-08-25 MED ORDER — DROPERIDOL 2.5 MG/ML IJ SOLN
2.5000 mg | Freq: Once | INTRAMUSCULAR | Status: AC
  Administered 2023-08-25: 2.5 mg via INTRAVENOUS
  Filled 2023-08-25: qty 2

## 2023-08-25 MED ORDER — KETOROLAC TROMETHAMINE 30 MG/ML IJ SOLN
15.0000 mg | Freq: Once | INTRAMUSCULAR | Status: AC
Start: 2023-08-25 — End: 2023-08-25
  Administered 2023-08-25: 15 mg via INTRAVENOUS
  Filled 2023-08-25: qty 1

## 2023-08-25 NOTE — ED Notes (Signed)
 Pt verbalized understanding of discharge instructions. Pt ambulated from ed with steady gait. Family to drive home

## 2023-08-25 NOTE — ED Notes (Signed)
 Pt reporting no relief of symptoms at this time

## 2023-08-25 NOTE — ED Triage Notes (Signed)
 Pt reports headache that started on Friday. Pt reports when she takes tylenol the pain stops "for a little bit." Pt also reports blurry vision. Denies hx of high BP.

## 2023-08-25 NOTE — ED Provider Notes (Signed)
 [place] EMERGENCY DEPARTMENT AT [facility] Provider Note  CSN: [id number] Arrival date & time: 08/25/23 9246  Chief Complaint(s) Headache  HPI Sara [name] is a 43 y.o. female here today for headache.  Patient says symptoms have been ongoing since Friday.  Patient endorses a sharp pain in the back of her neck which radiates up to her head.  She endorses feeling as though her vision is blurry.  Patient does have a history of headaches, but says that this is worse than they usually are.  There is no sudden onset, no fever, no chills.  Denies any neck manipulation.   Past Medical History Past Medical History:  Diagnosis Date   GERD (gastroesophageal reflux disease)    Patient Active Problem List   Diagnosis Date Noted   LUQ pain 11/17/2019   GERD without esophagitis 11/17/2019   Encounter for other preprocedural examination 11/17/2019   Normocytic anemia 02/05/2016   RUQ pain 02/16/2014   Other hemorrhoids 02/16/2014   TINEA PEDIS 08/31/2009   PLANTAR FASCIITIS, LEFT 07/18/2009   SKIN RASH 07/18/2009   Home Medication(s) Prior to Admission medications   Medication Sig Start Date End Date Taking? Authorizing Provider  amitriptyline  (ELAVIL ) 10 MG tablet Take 1 tablet (10 mg total) by mouth at bedtime. 06/11/22   Fleming, Zelda W, NP  pantoprazole  (PROTONIX ) 40 MG tablet Take 1 tablet (40 mg total) by mouth daily. FOR GERD 06/11/22   Theotis Haze ORN, NP                                                                                                                                    Past Surgical History Past Surgical History:  Procedure Laterality Date   CESAREAN SECTION     3x   TUBAL LIGATION     Family History Family History  Problem Relation Age of Onset   Breast cancer Maternal Grandmother    Diabetes Neg Hx    Hypertension Neg Hx    Colon cancer Neg Hx    Esophageal cancer Neg Hx    Pancreatic cancer Neg Hx    Stomach cancer Neg Hx    Liver  disease Neg Hx     Social History Social History   Tobacco Use   Smoking status: Never   Smokeless tobacco: Never  Vaping Use   Vaping status: Never Used  Substance Use Topics   Alcohol use: Yes    Comment: socially   Drug use: No   Allergies Patient has no known allergies.  Review of Systems Review of Systems  Physical Exam Vital Signs  I have reviewed the triage vital signs BP 123/79   Pulse (!) 54   Temp 97.9 F (36.6 C) (Oral)   Resp 16   SpO2 99%   Physical Exam Vitals reviewed.  Cardiovascular:     Rate and Rhythm: Normal rate.  Pulmonary:     Effort: Pulmonary effort  is normal.  Musculoskeletal:     Cervical back: Normal range of motion and neck supple. No rigidity.  Lymphadenopathy:     Cervical: No cervical adenopathy.  Neurological:     Mental Status: She is alert.     Cranial Nerves: No cranial nerve deficit or facial asymmetry.     Gait: Gait normal.     ED Results and Treatments Labs (all labs ordered are listed, but only abnormal results are displayed) Labs Reviewed  BASIC METABOLIC PANEL  CBC WITH DIFFERENTIAL/PLATELET  HCG, SERUM, QUALITATIVE  ACETAMINOPHEN  LEVEL                                                                                                                          Radiology CT ANGIO HEAD NECK W WO CM Result Date: 08/25/2023 CLINICAL DATA:  Provided history: Carotid artery dissection suspected. EXAM: CT ANGIOGRAPHY HEAD AND NECK WITH AND WITHOUT CONTRAST TECHNIQUE: Multidetector CT imaging of the head and neck was performed using the standard protocol during bolus administration of intravenous contrast. Multiplanar CT image reconstructions and MIPs were obtained to evaluate the vascular anatomy. Carotid stenosis measurements (when applicable) are obtained utilizing NASCET criteria, using the distal internal carotid diameter as the denominator. RADIATION DOSE REDUCTION: This exam was performed according to the departmental  dose-optimization program which includes automated exposure control, adjustment of the mA and/or kV according to patient size and/or use of iterative reconstruction technique. CONTRAST:  75mL OMNIPAQUE  IOHEXOL  350 MG/ML SOLN COMPARISON:  None. FINDINGS: CT HEAD FINDINGS Brain: Cerebral volume is normal. There is no acute intracranial hemorrhage. No demarcated cortical infarct. No extra-axial fluid collection. No evidence of an intracranial mass. No midline shift. Vascular: No hyperdense vessel. Skull: No calvarial fracture or aggressive osseous lesion. Sinuses/Orbits: No orbital mass or acute orbital finding. No significant paranasal sinus disease. Review of the MIP images confirms the above findings CTA NECK FINDINGS Aortic arch: Standard aortic branching. The visualized thoracic aorta is normal in caliber. Streak/beam hardening artifact arising from a dense left-sided contrast bolus partially obscures the left subclavian artery. Within this limitation, there is no appreciable hemodynamically significant innominate or proximal subclavian artery stenosis. Right carotid system: CCA and ICA patent within the neck without stenosis or significant atherosclerotic disease. No evidence of dissection. Left carotid system: CCA and ICA patent within the neck without stenosis or significant atherosclerotic disease. No evidence of dissection. Vertebral arteries: Codominant and patent within the neck without stenosis or significant atherosclerotic disease. Skeleton: No acute fracture or aggressive osseous lesion. Other neck: Subcentimeter nodule within the left thyroid lobe not meeting consensus criteria for ultrasound follow-up based on size. No follow-up imaging recommended. Reference: J Am Coll Radiol. 2015 Feb;12(2): 143-50. Upper chest: No consolidation within the imaged lung apices. Review of the MIP images confirms the above findings CTA HEAD FINDINGS Anterior circulation: The intracranial internal carotid arteries are  patent. The M1 middle cerebral arteries are patent. No M2 proximal branch occlusion or high-grade proximal stenosis.  The anterior cerebral arteries are patent. No intracranial aneurysm is identified. Posterior circulation: The intracranial vertebral arteries are patent. The basilar artery is patent. The posterior cerebral arteries are patent. A left posterior communicating artery is present. The right posterior communicating artery is diminutive or absent. Venous sinuses: Within the limitations of contrast timing, no convincing thrombus. Anatomic variants: As described. Review of the MIP images confirms the above findings IMPRESSION: Non-contrast head CT: No evidence of an acute intracranial abnormality. CTA neck: The common carotid, internal carotid and vertebral arteries are patent within the neck without stenosis or significant plaque disease. No evidence of dissection. CTA head: No proximal intracranial large vessel occlusion or high-grade proximal arterial stenosis identified. Electronically Signed   By: Rockey Childs D.O.   On: 08/25/2023 12:11    Pertinent labs & imaging results that were available during my care of the patient were reviewed by me and considered in my medical decision making (see MDM for details).  Medications Ordered in ED Medications  metoCLOPramide  (REGLAN ) injection 10 mg (10 mg Intravenous Given 08/25/23 0923)  iohexol  (OMNIPAQUE ) 350 MG/ML injection 75 mL (75 mLs Intravenous Contrast Given 08/25/23 1129)  ketorolac  (TORADOL ) 30 MG/ML injection 15 mg (15 mg Intravenous Given 08/25/23 1237)  droperidol  (INAPSINE ) 2.5 MG/ML injection 2.5 mg (2.5 mg Intravenous Given 08/25/23 1259)                                                                                                                                     Procedures Procedures  (including critical care time)  Medical Decision Making / ED Course   This patient presents to the ED for concern of headache, this involves an  extensive number of treatment options, and is a complaint that carries with it a high risk of complications and morbidity.  The differential diagnosis includes tension headache, migraine headache, carotid dissection, vertebral dissection, subarachnoid hemorrhage, IIH.  MDM: Will begin treating the patient with Reglan .  Patient with no neurological deficits on exam, however the concerning aspect of her story is sharp pain in the back of her neck with radiation forward.  May be tension type headache versus migraine, however cannot exclude dissection process.  Imaging ordered.  Patient interviewed with Spanish interpreter.  Considered IIH, however patient's report of visual blurriness seems to be more related to pain.  Reassessment 2 PM-patient CT angiography negative.  Patient's symptoms were still present with Reglan , but resolved with droperidol  and Toradol .  Return precautions discussed with patient at bedside.  Will discharge home.   Additional history obtained: -Additional history obtained from  -External records from outside source obtained and reviewed including: Chart review including previous notes, labs, imaging, consultation notes   Lab Tests: -I ordered, reviewed, and interpreted labs.   The pertinent results include:   Labs Reviewed  BASIC METABOLIC PANEL  CBC WITH DIFFERENTIAL/PLATELET  HCG, SERUM, QUALITATIVE  ACETAMINOPHEN  LEVEL  EKG   EKG Interpretation Date/Time:    Ventricular Rate:    PR Interval:    QRS Duration:    QT Interval:    QTC Calculation:   R Axis:      Text Interpretation:           Imaging Studies ordered: I ordered imaging studies including CT angiography head and neck I independently visualized and interpreted imaging. I agree with the radiologist interpretation   Medicines ordered and prescription drug management: Meds ordered this encounter  Medications   metoCLOPramide  (REGLAN ) injection 10 mg   iohexol  (OMNIPAQUE ) 350 MG/ML  injection 75 mL   ketorolac  (TORADOL ) 30 MG/ML injection 15 mg   droperidol  (INAPSINE ) 2.5 MG/ML injection 2.5 mg    -I have reviewed the patients home medicines and have made adjustments as needed   Cardiac Monitoring: The patient was maintained on a cardiac monitor.  I personally viewed and interpreted the cardiac monitored which showed an underlying rhythm of: Normal sinus rhythm  Social Determinants of Health:  Factors impacting patients care include: Lack of access to primary care   Reevaluation: After the interventions noted above, I reevaluated the patient and found that they have :improved  Co morbidities that complicate the patient evaluation  Past Medical History:  Diagnosis Date   GERD (gastroesophageal reflux disease)       Dispostion: I considered admission for this patient, however symptoms resolved with medical management.     Final Clinical Impression(s) / ED Diagnoses Final diagnoses:  Chronic nonintractable headache, unspecified headache type     @PCDICTATION @    Mannie Pac T, DO 08/25/23 1402

## 2023-08-25 NOTE — Discharge Instructions (Addendum)
 Return to the emergency room if you develop worsening headache, trouble with your vision, or if your headache does not seem to be getting better.  Please follow-up with your primary care doctor within 1 to 2 weeks.  If you do not have a primary care doctor, in your discharge paperwork there is information on how to get 1.

## 2024-04-29 IMAGING — MG MM DIGITAL SCREENING BILAT W/ TOMO AND CAD
8 series · 9 of 24 positions shown · non-contrast
Comparison: Previous exam(s).

CLINICAL DATA: Screening.

EXAM:
DIGITAL SCREENING BILATERAL MAMMOGRAM WITH TOMOSYNTHESIS AND CAD
TECHNIQUE: Bilateral screening digital craniocaudal and mediolateral oblique
mammograms were obtained. Bilateral screening digital breast
tomosynthesis was performed. The images were evaluated with
computer-aided detection.

[L MLO synth-2D]
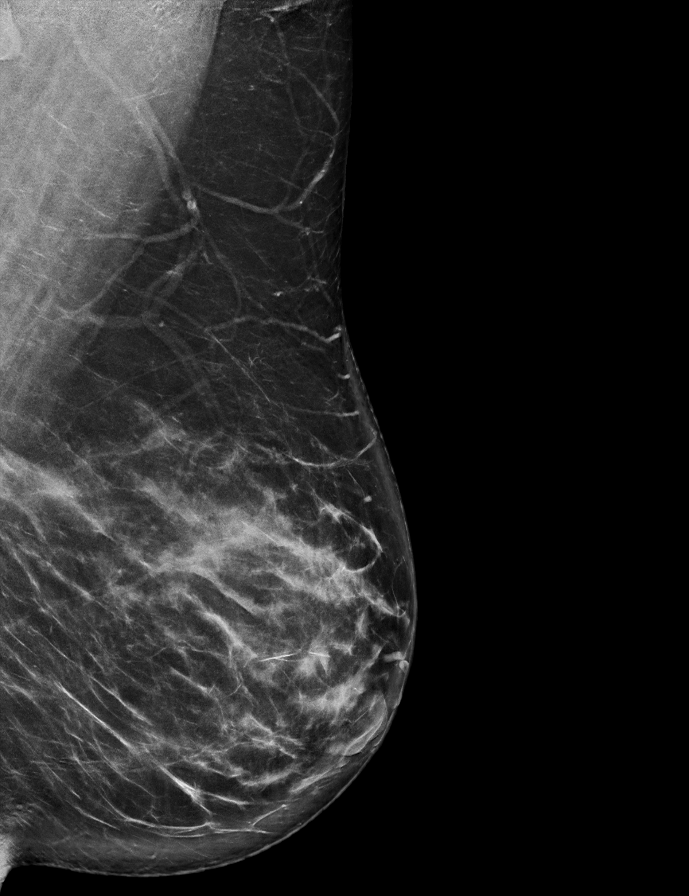

[R CC synth-2D]
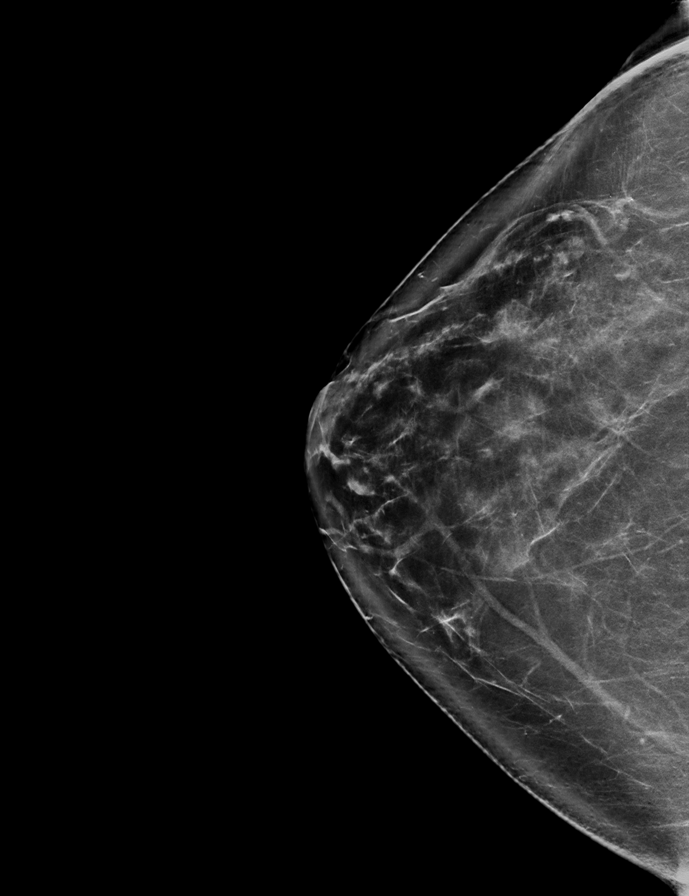

[L CC synth-2D]
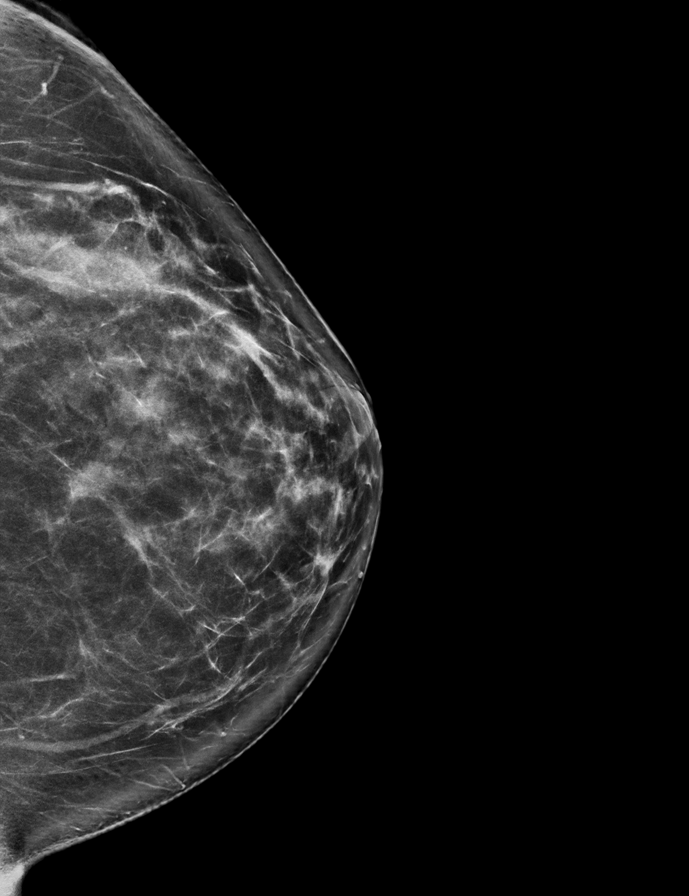

[R MLO synth-2D]
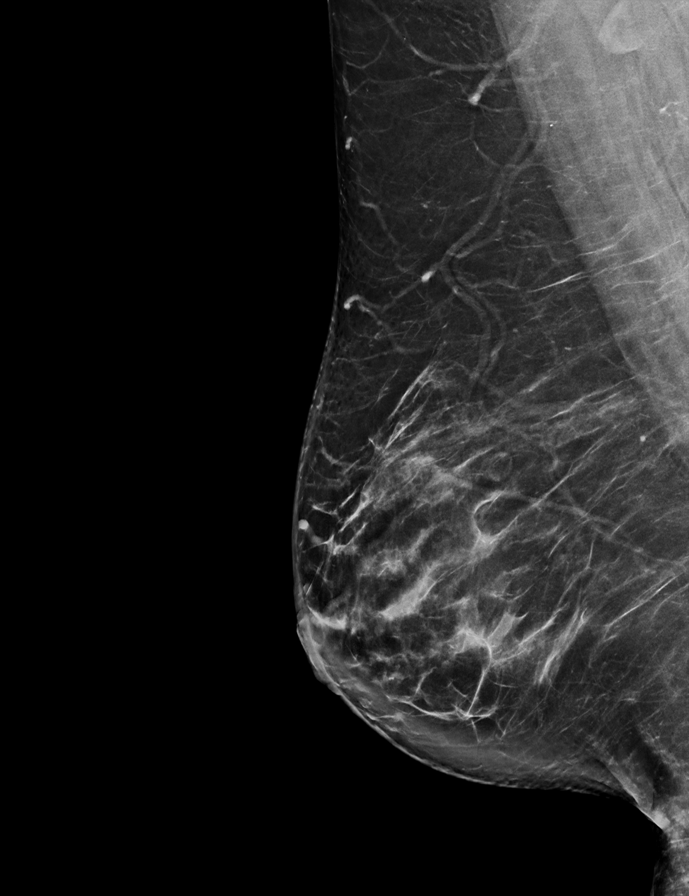

[L CC tomo · 2 of 81 frames shown]
[frame 27/81]
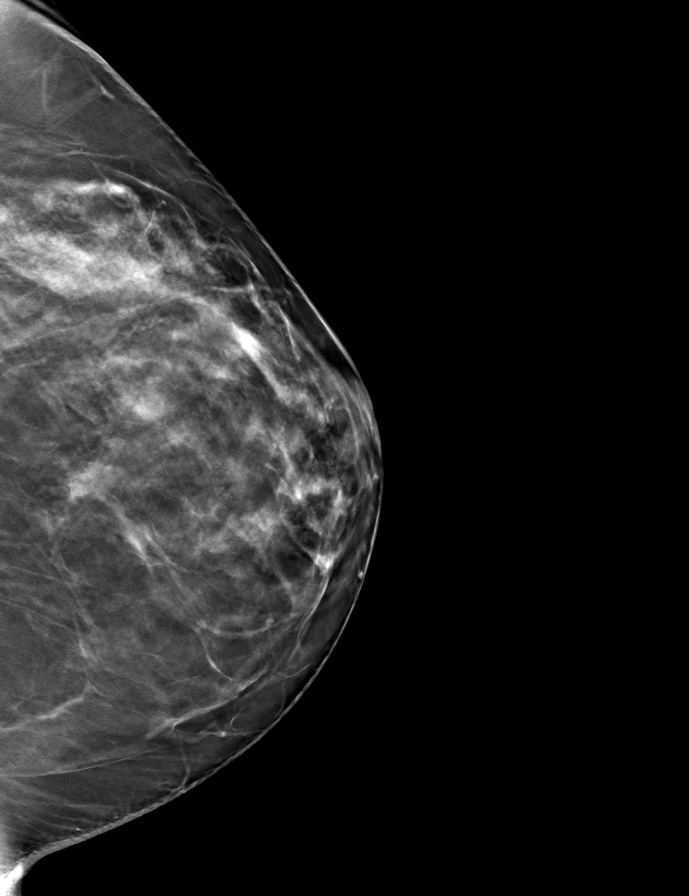
[frame 41/81]
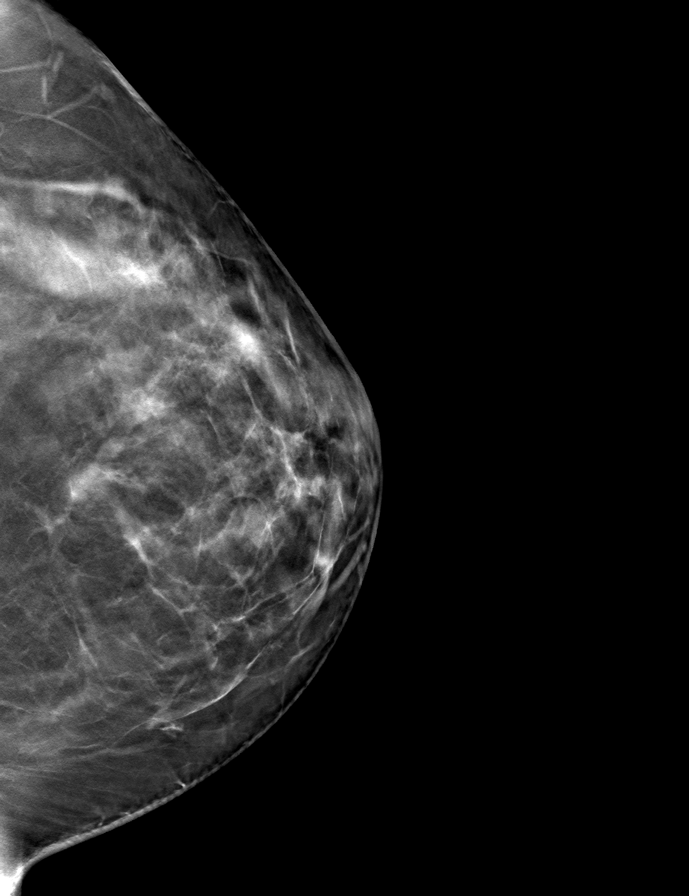

[L MLO tomo · tomo slice 45/88.0]
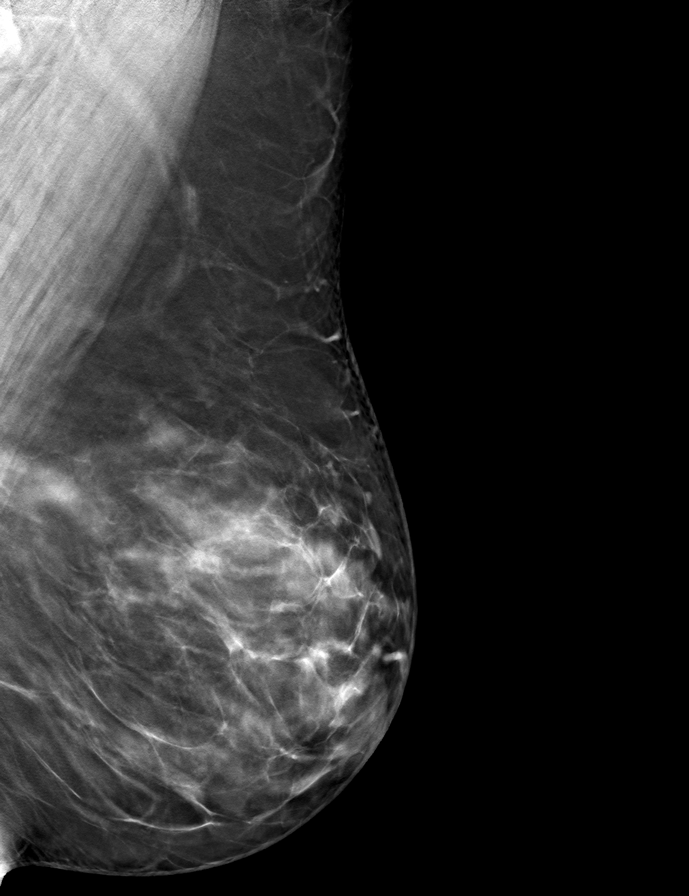

[R CC tomo · tomo slice 43/85.0]
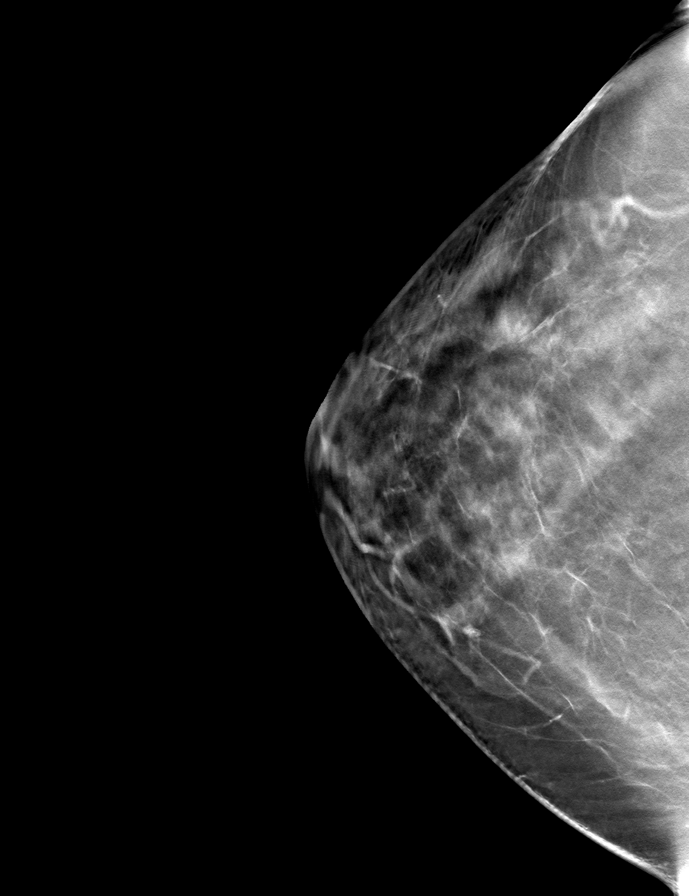

[R MLO tomo · tomo slice 45/88.0]
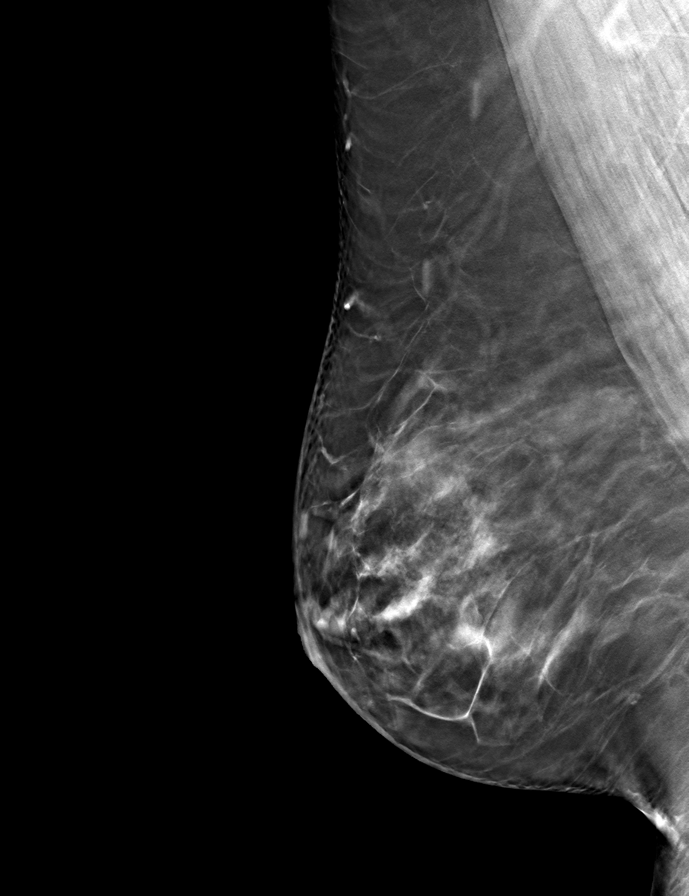

[9 of 24 positions shown; findings below may reference images not displayed]

ACR Breast Density Category c: The breast tissue is heterogeneously
dense, which may obscure small masses.
FINDINGS: There are no findings suspicious for malignancy.
IMPRESSION: No mammographic evidence of malignancy. A result letter of this
screening mammogram will be mailed directly to the patient.

RECOMMENDATION:
Screening mammogram in one year. (Code:Q3-W-BC3)

BI-RADS CATEGORY  1: Negative.

## 2024-06-12 DEATH — deceased
